# Patient Record
Sex: Female | Born: 2000 | Race: Black or African American | Hispanic: No | Marital: Single | Smoking: Never smoker
Health system: Southern US, Community
[De-identification: ages and names within clinical notes are randomized; demographics above are authoritative.]

## PROBLEM LIST (undated history)

## (undated) DIAGNOSIS — D571 Sickle-cell disease without crisis: Secondary | ICD-10-CM

---

## 2021-03-12 ENCOUNTER — Emergency Department (HOSPITAL_COMMUNITY)
Admission: EM | Admit: 2021-03-12 | Discharge: 2021-03-12 | Disposition: A | Discharge status: Home / Self Care | Payer: 59 | Attending: Emergency Medicine | Admitting: Emergency Medicine

## 2021-03-12 ENCOUNTER — Encounter (HOSPITAL_COMMUNITY): Payer: Self-pay

## 2021-03-12 ENCOUNTER — Emergency Department (HOSPITAL_COMMUNITY): Payer: 59

## 2021-03-12 ENCOUNTER — Other Ambulatory Visit: Payer: Self-pay

## 2021-03-12 DIAGNOSIS — R000 Tachycardia, unspecified: Secondary | ICD-10-CM | POA: Diagnosis not present

## 2021-03-12 DIAGNOSIS — J111 Influenza due to unidentified influenza virus with other respiratory manifestations: Secondary | ICD-10-CM

## 2021-03-12 DIAGNOSIS — J101 Influenza due to other identified influenza virus with other respiratory manifestations: Secondary | ICD-10-CM | POA: Insufficient documentation

## 2021-03-12 DIAGNOSIS — Z20822 Contact with and (suspected) exposure to covid-19: Secondary | ICD-10-CM | POA: Insufficient documentation

## 2021-03-12 DIAGNOSIS — R509 Fever, unspecified: Secondary | ICD-10-CM | POA: Diagnosis present

## 2021-03-12 HISTORY — DX: Sickle-cell disease without crisis: D57.1

## 2021-03-12 LAB — COMPREHENSIVE METABOLIC PANEL
ALT: 21 U/L (ref 0–44)
AST: 40 U/L (ref 15–41)
Albumin: 3.9 g/dL (ref 3.5–5.0)
Alkaline Phosphatase: 77 U/L (ref 38–126)
Anion gap: 10 (ref 5–15)
BUN: <5 mg/dL — ABNORMAL LOW (ref 6–20)
CO2: 22 mmol/L (ref 22–32)
Calcium: 9 mg/dL (ref 8.9–10.3)
Chloride: 102 mmol/L (ref 98–111)
Creatinine, Ser: 0.75 mg/dL (ref 0.44–1.00)
GFR, Estimated: >60 mL/min (ref >60)
Glucose, Bld: 131 mg/dL — ABNORMAL HIGH (ref 70–99)
Potassium: 3.9 mmol/L (ref 3.5–5.1)
Sodium: 134 mmol/L — ABNORMAL LOW (ref 135–145)
Total Bilirubin: 2.3 mg/dL — ABNORMAL HIGH (ref 0.3–1.2)
Total Protein: 7.7 g/dL (ref 6.5–8.1)

## 2021-03-12 LAB — CBC WITH DIFFERENTIAL/PLATELET
Abs Immature Granulocytes: 0.13 10*3/uL — ABNORMAL HIGH (ref 0.00–0.07)
Basophils Absolute: 0.1 10*3/uL (ref 0.0–0.1)
Basophils Relative: 0 %
Eosinophils Absolute: 0 10*3/uL (ref 0.0–0.5)
Eosinophils Relative: 0 %
HCT: 21.8 % — ABNORMAL LOW (ref 36.0–46.0)
Hemoglobin: 7.5 g/dL — ABNORMAL LOW (ref 12.0–15.0)
Immature Granulocytes: 1 %
Lymphocytes Relative: 10 %
Lymphs Abs: 1.6 10*3/uL (ref 0.7–4.0)
MCH: 39.9 pg — ABNORMAL HIGH (ref 26.0–34.0)
MCHC: 34.4 g/dL (ref 30.0–36.0)
MCV: 116 fL — ABNORMAL HIGH (ref 80.0–100.0)
Monocytes Absolute: 1.4 10*3/uL — ABNORMAL HIGH (ref 0.1–1.0)
Monocytes Relative: 9 %
Neutro Abs: 12.1 10*3/uL — ABNORMAL HIGH (ref 1.7–7.7)
Neutrophils Relative %: 80 %
Platelets: 438 10*3/uL — ABNORMAL HIGH (ref 150–400)
RBC: 1.88 MIL/uL — ABNORMAL LOW (ref 3.87–5.11)
RDW: 18.7 % — ABNORMAL HIGH (ref 11.5–15.5)
WBC Morphology: INCREASED
WBC: 15.2 10*3/uL — ABNORMAL HIGH (ref 4.0–10.5)
nRBC: 11.6 % — ABNORMAL HIGH (ref 0.0–0.2)

## 2021-03-12 LAB — RESP PANEL BY RT-PCR (FLU A&B, COVID) ARPGX2
Influenza A by PCR: POSITIVE — AB
Influenza B by PCR: NEGATIVE
SARS Coronavirus 2 by RT PCR: NEGATIVE

## 2021-03-12 LAB — GROUP A STREP BY PCR: Group A Strep by PCR: NOT DETECTED

## 2021-03-12 LAB — I-STAT BETA HCG BLOOD, ED (MC, WL, AP ONLY): I-stat hCG, quantitative: <5 m[IU]/mL (ref <5)

## 2021-03-12 MED ORDER — ACETAMINOPHEN 325 MG PO TABS
650.0000 mg | ORAL_TABLET | Freq: Once | ORAL | Status: AC | PRN
  Administered 2021-03-12: 650 mg via ORAL
  Filled 2021-03-12: qty 2

## 2021-03-12 NOTE — ED Provider Notes (Signed)
Emergency Medicine Provider Triage Evaluation Note  Sarah Patterson , a 20 y.o. female  was evaluated in triage.  Pt complains of fever, and cough with nasal congestion. She has a history of sickle cell anemia.  She is vaccinated against COVID.  Her symptoms started about a week ago and have been worsening.  She denies having any sickle cell pain crisis.  Review of Systems  Positive: Cough, nasal congestion, fever, sore throat.  Negative: Chest pain, shortness of breath  Physical Exam  BP 117/61 (BP Location: Left Arm)   Pulse (!) 119   Temp (!) 102 F (38.9 C) (Oral)   Resp 16   Ht 5\' 1"  (1.549 m)   Wt 52.2 kg   LMP 02/22/2021   SpO2 97%   BMI 21.73 kg/m  Gen:   Awake, no distress   Resp:  Normal effort  MSK:   Moves extremities without difficulty  Other:  Eating applesauce in no distress.  Medical Decision Making  Medically screening exam initiated at 12:33 PM.  Appropriate orders placed.  Stephene Nuon was informed that the remainder of the evaluation will be completed by another provider, this initial triage assessment does not replace that evaluation, and the importance of remaining in the ED until their evaluation is complete.  Given that patient has sickle cell and is presumed to be  asplenic with a fever and tachycardia will order lab work. Additionally given her cough and concern for getting better and then worse we will order a chest x-ray to evaluate for a superimposed bacterial pneumonia.  Note: Portions of this report may have been transcribed using voice recognition software. Every effort was made to ensure accuracy; however, inadvertent computerized transcription errors may be present      Faylene Million 03/12/21 1238    05/12/21, MD 03/12/21 [id number]

## 2021-03-12 NOTE — ED Provider Notes (Signed)
[facility] EMERGENCY DEPARTMENT Provider Note   CSN: [id number] Arrival date & time: 03/12/21  1049     History Chief Complaint  Patient presents with   Fever   Nasal Congestion    Sarah Patterson is a 20 y.o. female presenting for evaluation of fever, nasal congestion, cough.  Patient states she has had symptoms for a week.  Symptoms were improving until today when her fever came back.  Temperature of 103.  She reports nonproductive cough, sore throat, nasal congestion.  She has been taking Tylenol and allergy medicines with mild improvement of symptoms.  She denies chest pain, shortness of breath, nausea, vomiting, abdominal pain.  She has a history of sickle cell for which she takes medication, no other medical problems.  Baseline hemoglobin between 7 and 9.  She denies sick contacts.  She is here today because her fever came back and she was instructed by her PCP to be evaluated. She has been eating a drinking ok.   HPI     Past Medical History:  Diagnosis Date   Sickle cell anemia (HCC)     There are no problems to display for this patient.   History reviewed. No pertinent surgical history.   OB History   No obstetric history on file.     No family history on file.     Home Medications Prior to Admission medications   Medication Sig Start Date End Date Taking? Authorizing Provider  acetaminophen (TYLENOL) 500 MG tablet Take 500 mg by mouth every 6 (six) hours as needed for moderate pain.   Yes [provider]  celecoxib (CELEBREX) 100 MG capsule Take 100 mg by mouth 2 (two) times daily. 01/02/21  Yes [provider]  Cholecalciferol 25 MCG (1000 UT) tablet Take 1,000 Units by mouth daily. 05/25/17  Yes [provider]  gabapentin (NEURONTIN) 300 MG capsule Take 300-600 mg by mouth See admin instructions. Takes 300 mg in the morning 600 mg in the afternoon and night 02/17/21  Yes [provider]  hydroxyurea (HYDREA)  500 MG capsule Take 1,500 mg by mouth at bedtime. 03/07/21  Yes [provider]  lisinopril (ZESTRIL) 5 MG tablet Take 5 mg by mouth daily. 02/17/21  Yes [provider]  magnesium oxide (MAG-OX) 400 MG tablet Take 400 mg by mouth daily. 05/26/17  Yes [provider]  oxyCODONE (OXY IR/ROXICODONE) 5 MG immediate release tablet Take 5 mg by mouth every 6 (six) hours as needed for severe pain. 01/02/21  Yes [provider]  penicillin v potassium (VEETID) 250 MG tablet Take 250 mg by mouth 2 (two) times daily. 01/27/21  Yes [provider]    Allergies    Patient has no known allergies.  Review of Systems   Review of Systems  Constitutional:  Positive for fever.  HENT:  Positive for congestion.   Respiratory:  Positive for cough.   All other systems reviewed and are negative.  Physical Exam Updated Vital Signs BP 120/75 (BP Location: Right Arm)   Pulse (!) 108   Temp 99 F (37.2 C) (Oral)   Resp 20   Ht 5\' 1"  (1.549 m)   Wt 52.2 kg   LMP 02/22/2021   SpO2 98%   BMI 21.73 kg/m   Physical Exam Vitals and nursing note reviewed.  Constitutional:      General: She is not in acute distress.    Appearance: Normal appearance.     Comments: Resting in the  bed in NAD  HENT:     Head: Normocephalic and atraumatic.     Comments: OP mildly erythematous without tonsillar swelling or exudate.  Uvula midline.  No muffled voice.  Handling secretions easily Eyes:     Conjunctiva/sclera: Conjunctivae normal.     Pupils: Pupils are equal, round, and reactive to light.  Cardiovascular:     Rate and Rhythm: Regular rhythm. Tachycardia present.     Pulses: Normal pulses.     Heart sounds: Murmur heard.     Comments: Mildly tachycardic around 110 Pulmonary:     Effort: Pulmonary effort is normal. No respiratory distress.     Breath sounds: Normal breath sounds. No wheezing.     Comments: Speaking in full sentences.  Clear lung sounds in all fields.  SpO2 100% on RA Abdominal:     General: There is no distension.     Palpations: Abdomen is soft. There is no mass.     Tenderness: There is no abdominal tenderness. There is no guarding or rebound.  Musculoskeletal:        General: Normal range of motion.     Cervical back: Normal range of motion and neck supple.  Skin:    General: Skin is warm and dry.     Capillary Refill: Capillary refill takes less than 2 seconds.  Neurological:     Mental Status: She is alert and oriented to person, place, and time.  Psychiatric:        Mood and Affect: Mood and affect normal.        Speech: Speech normal.        Behavior: Behavior normal.    ED Results / Procedures / Treatments   Labs (all labs ordered are listed, but only abnormal results are displayed) Labs Reviewed  RESP PANEL BY RT-PCR (FLU A&B, COVID) ARPGX2 - Abnormal; Notable for the following components:      Result Value   Influenza A by PCR POSITIVE (*)    All other components within normal limits  CBC WITH DIFFERENTIAL/PLATELET - Abnormal; Notable for the following components:   WBC 15.2 (*)    RBC 1.88 (*)    Hemoglobin 7.5 (*)    HCT 21.8 (*)    MCV 116.0 (*)    MCH 39.9 (*)    RDW 18.7 (*)    Platelets 438 (*)    nRBC 11.6 (*)    Neutro Abs 12.1 (*)    Monocytes Absolute 1.4 (*)    Abs Immature Granulocytes 0.13 (*)    All other components within normal limits  COMPREHENSIVE METABOLIC PANEL - Abnormal; Notable for the following components:   Sodium 134 (*)    Glucose, Bld 131 (*)    BUN <5 (*)    Total Bilirubin 2.3 (*)    All other components within normal limits  GROUP A STREP BY PCR  LACTIC ACID, PLASMA  I-STAT BETA HCG BLOOD, ED ([facility], [facility], [facility] ONLY)    EKG None  Radiology DG Chest 2 View  Result Date: 03/12/2021 CLINICAL DATA:  Productive cough. EXAM: CHEST - 2 VIEW COMPARISON:  None. FINDINGS: The heart size and mediastinal contours are within normal limits. Both lungs are clear. The visualized skeletal  structures are unremarkable. IMPRESSION: No active cardiopulmonary disease. Electronically Signed   By: Lupita Raider M.D.   On: 03/12/2021 13:24    Procedures Procedures   Medications Ordered in ED Medications  acetaminophen (TYLENOL) tablet 650 mg (650 mg Oral Given  03/12/21 1221)    ED Course  I have reviewed the triage vital signs and the nursing notes.  Pertinent labs & imaging results that were available during my care of the patient were reviewed by me and considered in my medical decision making (see chart for details).   MDM Rules/Calculators/A&P                           Pt presenting for evaluation of 1 wk h/o URI sxs. On exam, pt appears nontoxic. She is mildly tachycardic, like 2/2 viral illness and fever.  Labs obtained in triage interpreted by me.  Flu positive.  Mild leukocytosis, likely reactive to illness.  Patient is anemic with hemoglobin of 7.5, however per patient this is her baseline.  Otherwise labs are reassuring.  Bili is mildly elevated, however this is likely due to dehydration and sickle cell.  She has no abdominal pain, doubt gallbladder etiology.  Chest x-ray viewed and independently declining, no pneumonia or some effusion.  Discussed findings with patient.  Discussed likely cause of her symptoms as flu and continue supportive treatment.  As she is a week into symptoms I will not offer Xofluza or Tamiflu.  As patient has no chest pain or shortness of breath, although I have low concern for acute chest.  However I discussed importance of close monitoring of respiratory status and prompt return with any of the symptoms.  At this time, patient appears safe for discharge.  Return precautions given.  Patient states she understands and agrees to plan.   Final Clinical Impression(s) / ED Diagnoses Final diagnoses:  Flu    Rx / DC Orders ED Discharge Orders     None        Alveria Apley, PA-C 03/12/21 1748    Terald Sleeper, MD 03/12/21 2108

## 2021-03-12 NOTE — Discharge Instructions (Signed)
You tested positive for flu. This is a viral illness that should be treated symptomatically. Use Tylenol or ibuprofen as needed for fevers or body aches. Use cough medicine as needed.  Make sure you stay well-hydrated with water. Wash your hands frequently to prevent spread of infection. You are considered infectious until you have been fever free for 24 hours without medication Follow-up with your primary care doctor in 1 week if your symptoms are not improving. Return to the emergency room if you develop chest pain, difficulty breathing, or any new or worsening symptoms.

## 2021-03-12 NOTE — ED Triage Notes (Signed)
Pt reports fever, cough and runny nose for the past week. Home COVID test was negative. Resp e.u

## 2022-06-15 ENCOUNTER — Emergency Department (HOSPITAL_COMMUNITY): Payer: BLUE CROSS/BLUE SHIELD

## 2022-06-15 ENCOUNTER — Other Ambulatory Visit: Payer: Self-pay

## 2022-06-15 ENCOUNTER — Emergency Department (HOSPITAL_COMMUNITY)
Admission: EM | Admit: 2022-06-15 | Discharge: 2022-06-16 | Disposition: A | Discharge status: Home / Self Care | Payer: BLUE CROSS/BLUE SHIELD | Attending: Emergency Medicine | Admitting: Emergency Medicine

## 2022-06-15 DIAGNOSIS — I10 Essential (primary) hypertension: Secondary | ICD-10-CM | POA: Insufficient documentation

## 2022-06-15 DIAGNOSIS — Z79899 Other long term (current) drug therapy: Secondary | ICD-10-CM | POA: Insufficient documentation

## 2022-06-15 DIAGNOSIS — R509 Fever, unspecified: Secondary | ICD-10-CM | POA: Diagnosis present

## 2022-06-15 DIAGNOSIS — D5700 Hb-SS disease with crisis, unspecified: Secondary | ICD-10-CM | POA: Insufficient documentation

## 2022-06-15 DIAGNOSIS — D649 Anemia, unspecified: Secondary | ICD-10-CM

## 2022-06-15 DIAGNOSIS — Z1152 Encounter for screening for COVID-19: Secondary | ICD-10-CM | POA: Insufficient documentation

## 2022-06-15 DIAGNOSIS — D571 Sickle-cell disease without crisis: Secondary | ICD-10-CM

## 2022-06-15 LAB — CBC WITH DIFFERENTIAL/PLATELET
Abs Immature Granulocytes: 0.04 10*3/uL (ref 0.00–0.07)
Basophils Absolute: 0 10*3/uL (ref 0.0–0.1)
Basophils Relative: 0 %
Eosinophils Absolute: 0 10*3/uL (ref 0.0–0.5)
Eosinophils Relative: 0 %
HCT: 18.8 % — ABNORMAL LOW (ref 36.0–46.0)
Hemoglobin: 6.5 g/dL — CL (ref 12.0–15.0)
Immature Granulocytes: 1 %
Lymphocytes Relative: 47 %
Lymphs Abs: 2.9 10*3/uL (ref 0.7–4.0)
MCH: 38.7 pg — ABNORMAL HIGH (ref 26.0–34.0)
MCHC: 34.6 g/dL (ref 30.0–36.0)
MCV: 111.9 fL — ABNORMAL HIGH (ref 80.0–100.0)
Monocytes Absolute: 0.4 10*3/uL (ref 0.1–1.0)
Monocytes Relative: 6 %
Neutro Abs: 2.8 10*3/uL (ref 1.7–7.7)
Neutrophils Relative %: 46 %
Platelets: 544 10*3/uL — ABNORMAL HIGH (ref 150–400)
RBC: 1.68 MIL/uL — ABNORMAL LOW (ref 3.87–5.11)
RDW: 17.2 % — ABNORMAL HIGH (ref 11.5–15.5)
WBC: 6.1 10*3/uL (ref 4.0–10.5)
nRBC: 34.7 % — ABNORMAL HIGH (ref 0.0–0.2)

## 2022-06-15 LAB — URINALYSIS, ROUTINE W REFLEX MICROSCOPIC
Bilirubin Urine: NEGATIVE
Glucose, UA: NEGATIVE mg/dL
Hgb urine dipstick: NEGATIVE
Ketones, ur: NEGATIVE mg/dL
Leukocytes,Ua: NEGATIVE
Nitrite: NEGATIVE
Protein, ur: NEGATIVE mg/dL
Specific Gravity, Urine: 1.008 (ref 1.005–1.030)
pH: 6 (ref 5.0–8.0)

## 2022-06-15 LAB — COMPREHENSIVE METABOLIC PANEL
ALT: 23 U/L (ref 0–44)
AST: 47 U/L — ABNORMAL HIGH (ref 15–41)
Albumin: 4.2 g/dL (ref 3.5–5.0)
Alkaline Phosphatase: 63 U/L (ref 38–126)
Anion gap: 8 (ref 5–15)
BUN: 11 mg/dL (ref 6–20)
CO2: 21 mmol/L — ABNORMAL LOW (ref 22–32)
Calcium: 8.6 mg/dL — ABNORMAL LOW (ref 8.9–10.3)
Chloride: 101 mmol/L (ref 98–111)
Creatinine, Ser: 0.76 mg/dL (ref 0.44–1.00)
GFR, Estimated: >60 mL/min (ref >60)
Glucose, Bld: 95 mg/dL (ref 70–99)
Potassium: 3.7 mmol/L (ref 3.5–5.1)
Sodium: 130 mmol/L — ABNORMAL LOW (ref 135–145)
Total Bilirubin: 1 mg/dL (ref 0.3–1.2)
Total Protein: 8.6 g/dL — ABNORMAL HIGH (ref 6.5–8.1)

## 2022-06-15 LAB — RESP PANEL BY RT-PCR (RSV, FLU A&B, COVID)  RVPGX2
Influenza A by PCR: NEGATIVE
Influenza B by PCR: NEGATIVE
Resp Syncytial Virus by PCR: NEGATIVE
SARS Coronavirus 2 by RT PCR: NEGATIVE

## 2022-06-15 LAB — I-STAT BETA HCG BLOOD, ED (MC, WL, AP ONLY): I-stat hCG, quantitative: <5 m[IU]/mL (ref <5)

## 2022-06-15 LAB — RETICULOCYTES
Immature Retic Fract: 13.7 % (ref 2.3–15.9)
RBC.: 1.69 MIL/uL — ABNORMAL LOW (ref 3.87–5.11)
Retic Count, Absolute: 43.1 10*3/uL (ref 19.0–186.0)
Retic Ct Pct: 2.6 % (ref 0.4–3.1)

## 2022-06-15 LAB — ABO/RH: ABO/RH(D): B POS

## 2022-06-15 LAB — LACTIC ACID, PLASMA: Lactic Acid, Venous: 0.9 mmol/L (ref 0.5–1.9)

## 2022-06-15 LAB — PREPARE RBC (CROSSMATCH)

## 2022-06-15 MED ORDER — SODIUM CHLORIDE 0.9 % IV SOLN
2.0000 g | Freq: Once | INTRAVENOUS | Status: AC
  Administered 2022-06-15: 2 g via INTRAVENOUS
  Filled 2022-06-15: qty 20

## 2022-06-15 MED ORDER — KETOROLAC TROMETHAMINE 30 MG/ML IJ SOLN
30.0000 mg | Freq: Once | INTRAMUSCULAR | Status: AC
  Administered 2022-06-15: 30 mg via INTRAVENOUS
  Filled 2022-06-15: qty 1

## 2022-06-15 MED ORDER — SODIUM CHLORIDE 0.9% IV SOLUTION: Freq: Once | INTRAVENOUS | Status: DC

## 2022-06-15 MED ORDER — CEFDINIR 300 MG PO CAPS: 300.0000 mg | ORAL_CAPSULE | Freq: Two times a day (BID) | ORAL | 0 refills | Status: AC

## 2022-06-15 MED ORDER — SODIUM CHLORIDE 0.9 % IV BOLUS
1000.0000 mL | Freq: Once | INTRAVENOUS | Status: AC
  Administered 2022-06-15: 1000 mL via INTRAVENOUS

## 2022-06-15 NOTE — ED Notes (Signed)
Will perform US PIV shortly. 

## 2022-06-15 NOTE — ED Notes (Signed)
Patient hard stick. Will Call IV team.

## 2022-06-15 NOTE — ED Provider Triage Note (Signed)
Emergency Medicine Provider Triage Evaluation Note  Sarah Patterson , a 22 y.o. female  was evaluated in triage.  Pt complains of fever today of 104 when she woke up.  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  She endorses nasal congestion but denies any other symptoms.  No sickle cell pain crisis type presentation.  She denies any chest pain, cough, dysuria, hematuria, increased urinary frequency.  Endorses nausea but no vomiting..  Review of Systems  Per HPI  Physical Exam  BP 104/62   Pulse 92   Temp 98.3 F (36.8 C) (Oral)   Resp 16   SpO2 100%  Gen:   Awake, no distress   Resp:  Normal effort  MSK:   Moves extremities without difficulty  Other:    Medical Decision Making  Medically screening exam initiated at 7:04 PM.  Appropriate orders placed.  Sarah Patterson was informed that the remainder of the evaluation will be completed by another provider, this initial triage assessment does not replace that evaluation, and the importance of remaining in the ED until their evaluation is complete.     Sarah Raring, PA-C 06/15/22 1904

## 2022-06-15 NOTE — ED Notes (Signed)
Labeled specimen cup given to pt for U/A collection per MD order. [employer] 

## 2022-06-15 NOTE — ED Triage Notes (Addendum)
Patient report of 104 fever this morning. Patient report took tylenol. Primary care recommended patient to come to ED. Pt denies N/V. Pt hx of Sickle cell.

## 2022-06-15 NOTE — ED Notes (Signed)
Unable to collect ordered labs d/t unable to access phlebotomy site. RN advised. [employer]

## 2022-06-15 NOTE — Discharge Instructions (Addendum)
You are anemic and your current hemoglobin is 6.5.  Per your sickle cell doctor, you need to get repeat hemoglobin check in 2 days.  You can go to student health and try and get a repeat hemoglobin check.  If not you may need to contact your sickle cell doctor and get an outpatient CBC ordered to be done at [place] can also try and call our sickle cell center to get scheduled for lab work and follow-up  Your sickle cell doctor recommend Omnicef 300 mg twice daily for 3 days.  Take Tylenol if you have a fever.  Return to ER if you have persistent fever or trouble breathing or weakness or passing out

## 2022-06-15 NOTE — ED Provider Notes (Signed)
[facility] DEPT Provider Note   CSN: [id number] Arrival date & time: 06/15/22  1735     History  Chief Complaint  Patient presents with   Fever    Sarah Patterson is a 22 y.o. female hx HTN, sickle cell anemia, here presenting with fever.  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  She states that she woke up this morning and had cold sweats.  She noted that she had a fever 104.  She took Tylenol and then went to the school and [facility] and was afebrile at that time. Denies any back or leg pain. Sent here for further evaluation. States that this is not typical for her sickle cell pain crisis. She is from [place].   The history is provided by the patient.       Home Medications Prior to Admission medications   Medication Sig Start Date End Date Taking? Authorizing Provider  acetaminophen (TYLENOL) 500 MG tablet Take 500 mg by mouth every 6 (six) hours as needed for moderate pain.    [provider]  celecoxib (CELEBREX) 100 MG capsule Take 100 mg by mouth 2 (two) times daily. 01/02/21   [provider]  Cholecalciferol 25 MCG (1000 UT) tablet Take 1,000 Units by mouth daily. 05/25/17   [provider]  gabapentin (NEURONTIN) 300 MG capsule Take 300-600 mg by mouth See admin instructions. Takes 300 mg in the morning 600 mg in the afternoon and night 02/17/21   [provider]  hydroxyurea (HYDREA) 500 MG capsule Take 1,500 mg by mouth at bedtime. 03/07/21   [provider]  lisinopril (ZESTRIL) 5 MG tablet Take 5 mg by mouth daily. 02/17/21   [provider]  magnesium oxide (MAG-OX) 400 MG tablet Take 400 mg by mouth daily. 05/26/17   [provider]  oxyCODONE (OXY IR/ROXICODONE) 5 MG immediate release tablet Take 5 mg by mouth every 6 (six) hours as needed for severe pain. 01/02/21   [provider]  penicillin v potassium (VEETID) 250 MG tablet Take 250 mg by mouth 2  (two) times daily. 01/27/21   [provider]      Allergies    Patient has no known allergies.    Review of Systems   Review of Systems  Constitutional:  Positive for fever.  All other systems reviewed and are negative.   Physical Exam Updated Vital Signs BP 104/62   Pulse 92   Temp 98.3 F (36.8 C) (Oral)   Resp 16   LMP 06/08/2022 (Approximate)   SpO2 100%  Physical Exam Vitals and nursing note reviewed.  Constitutional:      Appearance: Normal appearance.  HENT:     Head: Normocephalic.     Nose: Nose normal.     Mouth/Throat:     Mouth: Mucous membranes are moist.  Eyes:     Extraocular Movements: Extraocular movements intact.     Pupils: Pupils are equal, round, and reactive to light.  Cardiovascular:     Rate and Rhythm: Normal rate and regular rhythm.     Pulses: Normal pulses.     Heart sounds: Normal heart sounds.  Pulmonary:     Effort: Pulmonary effort is normal.     Breath sounds: Normal breath sounds.  Abdominal:     General: Abdomen is flat.     Palpations: Abdomen is soft.  Musculoskeletal:        General: Normal range of motion.  Cervical back: Normal range of motion and neck supple.  Skin:    General: Skin is warm.     Capillary Refill: Capillary refill takes less than 2 seconds.  Neurological:     General: No focal deficit present.     Mental Status: She is alert and oriented to person, place, and time.  Psychiatric:        Mood and Affect: Mood normal.        Behavior: Behavior normal.     ED Results / Procedures / Treatments   Labs (all labs ordered are listed, but only abnormal results are displayed) Labs Reviewed  RETICULOCYTES - Abnormal; Notable for the following components:      Result Value   RBC. 1.69 (*)    All other components within normal limits  RESP PANEL BY RT-PCR (RSV, FLU A&B, COVID)  RVPGX2  COMPREHENSIVE METABOLIC PANEL  CBC WITH DIFFERENTIAL/PLATELET  URINALYSIS, ROUTINE W REFLEX MICROSCOPIC   LACTIC ACID, PLASMA  LACTIC ACID, PLASMA  I-STAT BETA HCG BLOOD, ED ([facility], [facility], [facility] ONLY)    EKG None  Radiology DG Chest 2 View  Result Date: 06/15/2022 CLINICAL DATA:  Cough EXAM: CHEST - 2 VIEW COMPARISON:  03/12/2021 FINDINGS: The heart size and mediastinal contours are within normal limits. Both lungs are clear. The visualized skeletal structures are unremarkable. IMPRESSION: No active cardiopulmonary disease. Electronically Signed   By: Helyn Numbers M.D.   On: 06/15/2022 19:35    Procedures Procedures    Medications Ordered in ED Medications  sodium chloride 0.9 % bolus 1,000 mL (1,000 mLs Intravenous New Bag/Given 06/15/22 2106)  ketorolac (TORADOL) 30 MG/ML injection 30 mg (30 mg Intravenous Given 06/15/22 2106)    ED Course/ Medical Decision Making/ A&P                           Medical Decision Making Sarah Patterson is a 22 y.o. female here presenting with fever.  She has sickle cell but this is not typical of her crisis.  She is afebrile and no oxygen requirement. Consider acute chest vs pneumonia vs viral syndrome vs UTI. Will get cbc, cmp, ua, cxr, covid/flu/rsv. Will hydrate and reassess.   10:04 PM Patient's white blood cell count is normal.  Hemoglobin is 6.5.  On care everywhere her most recent hemoglobin is around 8.5.  Reticulocyte count is normal.  Lactate is normal and chest x-ray is clear.  Patient states that she has received transfusion previously and she has no active bleeding.  Her menses ended about a week ago.  She also has antibodies as well.  At this point, she will be admitted for transfusion.  Also will need to observe for fever curve.  I think she likely has a viral illness but will get blood cultures.  Will hold antibiotics for now.  11:09 PM Patient called her sickle cell doctor was told that she does not need a transfusion.  I was able to call her sickle cell doctor.  I discussed with Dr. Myrene Galas from hematology in [place].  She was able to look up her  recent hemoglobin which was 6.74 days ago.  They held off on transfusion since she was not actively bleeding and she appears well.  She does recommend 2 g of Rocephin and 3 days of Omnicef and also repeat hemoglobin in 2 days.  Since patient is afebrile and has normal white count, she does not think that patient needs to be  admitted for IV antibiotics.  Patient is agreeable with the plan.  Problems Addressed: Anemia, unspecified type: acute illness or injury Fever, unspecified fever cause: chronic illness or injury Sickle cell disease without crisis [facility]): acute illness or injury  Amount and/or Complexity of Data Reviewed Labs: ordered. Decision-making details documented in ED Course. Radiology: ordered and independent interpretation performed. Decision-making details documented in ED Course.  Risk Prescription drug management.    Final Clinical Impression(s) / ED Diagnoses Final diagnoses:  None    Rx / DC Orders ED Discharge Orders     None         Charlynne Pander, MD 06/15/22 2311

## 2022-06-19 LAB — TYPE AND SCREEN
ABO/RH(D): B POS
Antibody Screen: NEGATIVE
Unit division: 0

## 2022-06-19 LAB — BPAM RBC
Blood Product Expiration Date: 202401282359
Unit Type and Rh: 7300

## 2022-06-20 LAB — CULTURE, BLOOD (ROUTINE X 2): Culture: NO GROWTH

## 2022-06-22 ENCOUNTER — Observation Stay (HOSPITAL_COMMUNITY)
Admission: EM | Admit: 2022-06-22 | Discharge: 2022-06-23 | Disposition: A | Discharge status: Home / Self Care | Payer: BLUE CROSS/BLUE SHIELD | Attending: Internal Medicine | Admitting: Internal Medicine

## 2022-06-22 ENCOUNTER — Encounter (HOSPITAL_COMMUNITY): Payer: Self-pay

## 2022-06-22 ENCOUNTER — Other Ambulatory Visit: Payer: Self-pay

## 2022-06-22 DIAGNOSIS — D5700 Hb-SS disease with crisis, unspecified: Secondary | ICD-10-CM | POA: Diagnosis present

## 2022-06-22 DIAGNOSIS — D649 Anemia, unspecified: Secondary | ICD-10-CM | POA: Diagnosis not present

## 2022-06-22 DIAGNOSIS — Z79899 Other long term (current) drug therapy: Secondary | ICD-10-CM | POA: Insufficient documentation

## 2022-06-22 LAB — CBC WITH DIFFERENTIAL/PLATELET
Abs Immature Granulocytes: 0.03 10*3/uL (ref 0.00–0.07)
Basophils Absolute: 0 10*3/uL (ref 0.0–0.1)
Basophils Relative: 0 %
Eosinophils Absolute: 0 10*3/uL (ref 0.0–0.5)
Eosinophils Relative: 0 %
HCT: 14.8 % — ABNORMAL LOW (ref 36.0–46.0)
Hemoglobin: 5.2 g/dL — CL (ref 12.0–15.0)
Immature Granulocytes: 1 %
Lymphocytes Relative: 52 %
Lymphs Abs: 3.2 10*3/uL (ref 0.7–4.0)
MCH: 40 pg — ABNORMAL HIGH (ref 26.0–34.0)
MCHC: 35.1 g/dL (ref 30.0–36.0)
MCV: 113.8 fL — ABNORMAL HIGH (ref 80.0–100.0)
Monocytes Absolute: 0.6 10*3/uL (ref 0.1–1.0)
Monocytes Relative: 10 %
Neutro Abs: 2.3 10*3/uL (ref 1.7–7.7)
Neutrophils Relative %: 37 %
Platelets: 262 10*3/uL (ref 150–400)
RBC: 1.3 MIL/uL — ABNORMAL LOW (ref 3.87–5.11)
RDW: 18.4 % — ABNORMAL HIGH (ref 11.5–15.5)
WBC: 6.1 10*3/uL (ref 4.0–10.5)
nRBC: 10.1 % — ABNORMAL HIGH (ref 0.0–0.2)

## 2022-06-22 LAB — URINALYSIS, ROUTINE W REFLEX MICROSCOPIC
Bacteria, UA: NONE SEEN
Bilirubin Urine: NEGATIVE
Glucose, UA: NEGATIVE mg/dL
Hgb urine dipstick: NEGATIVE
Ketones, ur: NEGATIVE mg/dL
Leukocytes,Ua: NEGATIVE
Nitrite: NEGATIVE
Protein, ur: NEGATIVE mg/dL
Specific Gravity, Urine: 1.009 (ref 1.005–1.030)
pH: 6 (ref 5.0–8.0)

## 2022-06-22 LAB — BASIC METABOLIC PANEL
Anion gap: 7 (ref 5–15)
BUN: 6 mg/dL (ref 6–20)
CO2: 23 mmol/L (ref 22–32)
Calcium: 8.4 mg/dL — ABNORMAL LOW (ref 8.9–10.3)
Chloride: 104 mmol/L (ref 98–111)
Creatinine, Ser: 0.77 mg/dL (ref 0.44–1.00)
GFR, Estimated: >60 mL/min (ref >60)
Glucose, Bld: 103 mg/dL — ABNORMAL HIGH (ref 70–99)
Potassium: 3.8 mmol/L (ref 3.5–5.1)
Sodium: 134 mmol/L — ABNORMAL LOW (ref 135–145)

## 2022-06-22 LAB — RETICULOCYTES
Immature Retic Fract: 10.4 % (ref 2.3–15.9)
RBC.: 1.32 MIL/uL — ABNORMAL LOW (ref 3.87–5.11)
Retic Count, Absolute: 19.8 10*3/uL (ref 19.0–186.0)
Retic Ct Pct: 1.5 % (ref 0.4–3.1)

## 2022-06-22 LAB — PREPARE RBC (CROSSMATCH)

## 2022-06-22 LAB — PROTIME-INR
INR: 1.1 (ref 0.8–1.2)
Prothrombin Time: 14.3 seconds (ref 11.4–15.2)

## 2022-06-22 LAB — APTT: aPTT: 30 seconds (ref 24–36)

## 2022-06-22 MED ORDER — SODIUM CHLORIDE 0.9% IV SOLUTION: Freq: Once | INTRAVENOUS | Status: AC

## 2022-06-22 NOTE — ED Triage Notes (Signed)
Patient said her clinic called and said her hemoglobin was 5.6. Has sickle cell. No dizziness but feels tired.

## 2022-06-22 NOTE — ED Provider Triage Note (Signed)
Emergency Medicine Provider Triage Evaluation Note  Sarah Patterson , a 22 y.o. female  was evaluated in triage.  Pt complains of abnormal hemoglobin. States sickle cell clinic reported hgb 5.6 to her. She has been feeling generally weak and fatigued but otherwise denies chest pain, abdominal pain, nausea, vomiting, diarrhea, lightheadedness or dizziness, body aches, cough, congestion, fever, or any other complaints. She denies any known source of bleeding including bloody stools or heavy vaginal bleeding. States has not required a blood transfusion in many years. States typical pain crises consist of chest pain but she is not having that right now.   Review of Systems  Positive: See HPI Negative: See HPI  Physical Exam  BP (!) 112/59 (BP Location: Right Arm)   Pulse 79   Temp 99 F (37.2 C) (Oral)   Resp 19   Ht 5\' 1"  (1.549 m)   Wt 55.3 kg   LMP 06/08/2022 (Approximate)   SpO2 96%   BMI 23.05 kg/m  Gen:   Awake, no distress   Resp:  Normal effort LCTA MSK:   Moves extremities without difficulty  Other:  RRR. Abdomen soft and nontender, no chest wall tenderness, alert and oriented, very pale conjunctiva  Medical Decision Making  Medically screening exam initiated at 2:32 PM.  Appropriate orders placed.  Kaytlynne Kentner was informed that the remainder of the evaluation will be completed by another provider, this initial triage assessment does not replace that evaluation, and the importance of remaining in the ED until their evaluation is complete.     Suzzette Righter, PA-C 06/22/22 1435

## 2022-06-22 NOTE — ED Provider Notes (Signed)
[facility] DEPT Provider Note   CSN: [id number] Arrival date & time: 06/22/22  1338     History  Chief Complaint  Patient presents with   Abnormal Labs    Sarah Patterson is a 22 y.o. female.  22 year old female who presents due to weakness and low hemoglobin.  Patient has history of sickle cell disease and denies being in crisis.  States that she had routine blood work done and hemoglobin was 5.6.  Normally runs around 7.5.  Patient's last transfusion was several years ago.  Denies any heavy periods, black stools, vomiting blood.  She denies any fever or chills.  Patient has been compliant with her sickle cell meds       Home Medications Prior to Admission medications   Medication Sig Start Date End Date Taking? Authorizing Provider  acetaminophen (TYLENOL) 500 MG tablet Take 500 mg by mouth every 6 (six) hours as needed for moderate pain.    [provider]  celecoxib (CELEBREX) 100 MG capsule Take 100 mg by mouth 2 (two) times daily. 01/02/21   [provider]  Cholecalciferol 25 MCG (1000 UT) tablet Take 1,000 Units by mouth daily. 05/25/17   [provider]  gabapentin (NEURONTIN) 300 MG capsule Take 300-600 mg by mouth See admin instructions. Takes 300 mg in the morning 600 mg in the afternoon and night 02/17/21   [provider]  hydroxyurea (HYDREA) 500 MG capsule Take 1,500 mg by mouth at bedtime. 03/07/21   [provider]  lisinopril (ZESTRIL) 5 MG tablet Take 5 mg by mouth daily. 02/17/21   [provider]  magnesium oxide (MAG-OX) 400 MG tablet Take 400 mg by mouth daily. 05/26/17   [provider]  oxyCODONE (OXY IR/ROXICODONE) 5 MG immediate release tablet Take 5 mg by mouth every 6 (six) hours as needed for severe pain. 01/02/21   [provider]  penicillin v potassium (VEETID) 250 MG tablet Take 250 mg by mouth 2 (two) times daily. 01/27/21   [provider]       Allergies    Patient has no known allergies.    Review of Systems   Review of Systems  All other systems reviewed and are negative.   Physical Exam Updated Vital Signs BP (!) 112/59 (BP Location: Right Arm)   Pulse 79   Temp 99 F (37.2 C) (Oral)   Resp 19   Ht 1.549 m (5\' 1" )   Wt 55.3 kg   LMP 06/08/2022 (Approximate)   SpO2 96%   BMI 23.05 kg/m  Physical Exam Vitals and nursing note reviewed.  Constitutional:      General: She is not in acute distress.    Appearance: Normal appearance. She is well-developed. She is not toxic-appearing.  HENT:     Head: Normocephalic and atraumatic.  Eyes:     General: Lids are normal.     Conjunctiva/sclera: Conjunctivae normal.     Pupils: Pupils are equal, round, and reactive to light.  Neck:     Thyroid: No thyroid mass.     Trachea: No tracheal deviation.  Cardiovascular:     Rate and Rhythm: Normal rate and regular rhythm.     Heart sounds: Normal heart sounds. No murmur heard.    No gallop.  Pulmonary:     Effort: Pulmonary effort is normal. No respiratory distress.     Breath sounds: Normal breath sounds. No stridor. No decreased breath sounds, wheezing, rhonchi or rales.  Abdominal:  General: There is no distension.     Palpations: Abdomen is soft.     Tenderness: There is no abdominal tenderness. There is no rebound.  Musculoskeletal:        General: No tenderness. Normal range of motion.     Cervical back: Normal range of motion and neck supple.  Skin:    General: Skin is warm and dry.     Findings: No abrasion or rash.  Neurological:     Mental Status: She is alert and oriented to person, place, and time. Mental status is at baseline.     GCS: GCS eye subscore is 4. GCS verbal subscore is 5. GCS motor subscore is 6.     Cranial Nerves: No cranial nerve deficit.     Sensory: No sensory deficit.     Motor: Motor function is intact.  Psychiatric:        Attention and Perception: Attention normal.         Speech: Speech normal.        Behavior: Behavior normal.     ED Results / Procedures / Treatments   Labs (all labs ordered are listed, but only abnormal results are displayed) Labs Reviewed  CBC WITH DIFFERENTIAL/PLATELET  PROTIME-INR  APTT  RETICULOCYTES  BASIC METABOLIC PANEL  URINALYSIS, ROUTINE W REFLEX MICROSCOPIC  TYPE AND SCREEN    EKG None  Radiology No results found.  Procedures Procedures    Medications Ordered in ED Medications  0.9 %  sodium chloride infusion (Manually program via Guardrails IV Fluids) (has no administration in time range)    ED Course/ Medical Decision Making/ A&P                             Medical Decision Making Amount and/or Complexity of Data Reviewed Labs: ordered.  Risk Prescription drug management.   Patient to receive 2 units of packed red blood cells here.  Signed off to next provider        Final Clinical Impression(s) / ED Diagnoses Final diagnoses:  None    Rx / DC Orders ED Discharge Orders     None         Lacretia Leigh, MD 06/22/22 2330

## 2022-06-23 DIAGNOSIS — D5700 Hb-SS disease with crisis, unspecified: Secondary | ICD-10-CM | POA: Diagnosis present

## 2022-06-23 DIAGNOSIS — D649 Anemia, unspecified: Secondary | ICD-10-CM | POA: Diagnosis not present

## 2022-06-23 LAB — CBC WITH DIFFERENTIAL/PLATELET
Abs Immature Granulocytes: 0.01 10*3/uL (ref 0.00–0.07)
Basophils Absolute: 0 10*3/uL (ref 0.0–0.1)
Basophils Relative: 0 %
Eosinophils Absolute: 0 10*3/uL (ref 0.0–0.5)
Eosinophils Relative: 0 %
HCT: 24.8 % — ABNORMAL LOW (ref 36.0–46.0)
Hemoglobin: 8.6 g/dL — ABNORMAL LOW (ref 12.0–15.0)
Immature Granulocytes: 0 %
Lymphocytes Relative: 40 %
Lymphs Abs: 2.2 10*3/uL (ref 0.7–4.0)
MCH: 30.2 pg (ref 26.0–34.0)
MCHC: 34.7 g/dL (ref 30.0–36.0)
MCV: 87 fL (ref 80.0–100.0)
Monocytes Absolute: 0.6 10*3/uL (ref 0.1–1.0)
Monocytes Relative: 12 %
Neutro Abs: 2.6 10*3/uL (ref 1.7–7.7)
Neutrophils Relative %: 48 %
Platelets: 199 10*3/uL (ref 150–400)
RBC: 2.85 MIL/uL — ABNORMAL LOW (ref 3.87–5.11)
RDW: 33.6 % — ABNORMAL HIGH (ref 11.5–15.5)
WBC: 5.5 10*3/uL (ref 4.0–10.5)
nRBC: 11.7 % — ABNORMAL HIGH (ref 0.0–0.2)

## 2022-06-23 LAB — COMPREHENSIVE METABOLIC PANEL
ALT: 25 U/L (ref 0–44)
AST: 57 U/L — ABNORMAL HIGH (ref 15–41)
Albumin: 3.3 g/dL — ABNORMAL LOW (ref 3.5–5.0)
Alkaline Phosphatase: 62 U/L (ref 38–126)
Anion gap: 12 (ref 5–15)
BUN: 8 mg/dL (ref 6–20)
CO2: 20 mmol/L — ABNORMAL LOW (ref 22–32)
Calcium: 8.5 mg/dL — ABNORMAL LOW (ref 8.9–10.3)
Chloride: 104 mmol/L (ref 98–111)
Creatinine, Ser: 0.65 mg/dL (ref 0.44–1.00)
GFR, Estimated: >60 mL/min (ref >60)
Glucose, Bld: 103 mg/dL — ABNORMAL HIGH (ref 70–99)
Potassium: 4.3 mmol/L (ref 3.5–5.1)
Sodium: 136 mmol/L (ref 135–145)
Total Bilirubin: 0.8 mg/dL (ref 0.3–1.2)
Total Protein: 7.4 g/dL (ref 6.5–8.1)

## 2022-06-23 LAB — RETICULOCYTES
Immature Retic Fract: 10.4 % (ref 2.3–15.9)
RBC.: 2.84 MIL/uL — ABNORMAL LOW (ref 3.87–5.11)
Retic Count, Absolute: 43.5 10*3/uL (ref 19.0–186.0)
Retic Ct Pct: 1.5 % (ref 0.4–3.1)

## 2022-06-23 LAB — HEMOGLOBIN AND HEMATOCRIT, BLOOD
HCT: 17.5 % — ABNORMAL LOW (ref 36.0–46.0)
Hemoglobin: 6.3 g/dL — CL (ref 12.0–15.0)

## 2022-06-23 LAB — MAGNESIUM: Magnesium: 1.8 mg/dL (ref 1.7–2.4)

## 2022-06-23 MED ORDER — ACETAMINOPHEN 650 MG RE SUPP: 650.0000 mg | Freq: Four times a day (QID) | RECTAL | Status: DC | PRN

## 2022-06-23 MED ORDER — MELATONIN 3 MG PO TABS: 3.0000 mg | ORAL_TABLET | Freq: Every evening | ORAL | Status: DC | PRN

## 2022-06-23 MED ORDER — ACETAMINOPHEN 325 MG PO TABS
650.0000 mg | ORAL_TABLET | Freq: Four times a day (QID) | ORAL | Status: DC | PRN
  Administered 2022-06-23: 650 mg via ORAL
  Filled 2022-06-23: qty 2

## 2022-06-23 NOTE — Discharge Summary (Signed)
Physician Discharge Summary  [facility] OAC:[id number] DOB: [birth year] DOA: 06/22/2022  PCP: Pcp, No  Admit date: 06/22/2022  Discharge date: 06/23/2022  Discharge Diagnoses:  Principal Problem:   Acute on chronic anemia Active Problems:   Sickle cell pain crisis [facility])   Discharge Condition: Stable  Disposition:   Follow-up Information     Massie Maroon, FNP Follow up.   Specialty: Family Medicine Why: Follow up for labs on 06/26/2022 Contact information: [address] [place] [address] [phone]                Pt is discharged home in good condition and is to follow up with Pcp, No this week to have labs evaluated. Sarah Patterson is instructed to increase activity slowly and balance with rest for the next few days, and use prescribed medication to complete treatment of pain  Diet: Regular Wt Readings from Last 3 Encounters:  06/22/22 55.3 kg  03/12/21 52.2 kg (27 %, Z= -0.60)*   * Growth percentiles are based on CDC (Girls, 2-20 Years) data.   Expand All Collapse All  H&P  Patient Demographics:  Sarah Patterson, is a 22 y.o. female  MRN: [id number]   DOB - 08/02/2000   Admit Date - 06/22/2022   Outpatient Primary MD for the patient is Pcp, No      Chief Complaint  Patient presents with   Abnormal Labs       HPI:    Sarah Patterson  is a 22 y.o. female with a known history of sickle cell disease who presented to the emergency department by way of instruction from her outpatient hematologist for acute on chronic anemia identified per her outpatient labs. Patient lives in [place], but is currently a Consulting civil engineer at the [employer].  Patient states that she had an objective fever 1 week ago.  She states that fever was resolved with acetaminophen, noting no residual or ensuing development of additional fever.  Patient's most recent hemoglobin was 5.6, relative to her baseline hemoglobin.  She was then contacted by her hematologist  with these results and instructed to present to the local emergency department for further evaluation and management thereof, including PRBC transfusion.  The patient is otherwise without acute complaints.  Denies any acute pain or shortness of breath, no dizziness.  No hematuria, melena, or hematochezia. EDP discussed with on-call hematologist for the patient's [facility] hematology team.  Recommended observation and 2 units PRBCs. Patient states that she feels tired.  She is not having any pain at this time.  Patient is status post 2 units PRBCs without complication.  She denies any headache, shortness of breath, urinary symptoms, nausea, vomiting, or diarrhea.   Hospital Course:  Acute on chronic anemia: On admission, hemoglobin 5.3 g/dL.  Patient received 2 units PRBCs.  Hemoglobin 8.6 prior to discharge.  Patient will follow-up in sickle cell clinic on 06/26/2022 for CBC with differential. Patient is not having any pain at this time.  Patient will discharge home in a hemodynamically stable condition.  Sarah Patterson will follow-up with PCP within 1 week of this discharge. Sarah Patterson was counseled extensively about nonpharmacologic means of pain management, patient verbalized understanding and was appreciative of  the care received during this admission.   We discussed the need for good hydration, monitoring of hydration status, avoidance of heat, cold, stress, and infection triggers. We discussed the need to be adherent with taking Hydrea and other home medications. Patient was reminded of the  need to seek medical attention immediately if any symptom of bleeding, anemia, or infection occurs.  Discharge Exam: Vitals:   06/23/22 0800 06/23/22 1146  BP: 100/60   Pulse: (!) 51   Resp: (!) 21   Temp: 98.3 F (36.8 C) 98 F (36.7 C)  SpO2: 97%    Vitals:   06/23/22 0425 06/23/22 0645 06/23/22 0800 06/23/22 1146  BP: 107/74 106/62 100/60   Pulse: 68 70 (!) 51   Resp: (!) 24 20 (!) 21   Temp: 98.5 F (36.9  C)  98.3 F (36.8 C) 98 F (36.7 C)  TempSrc: Oral   Oral  SpO2: 99% 98% 97%   Weight:      Height:        General appearance : Awake, alert, not in any distress. Speech Clear. Not toxic looking HEENT: Atraumatic and Normocephalic, pupils equally reactive to light and accomodation Neck: Supple, no JVD. No cervical lymphadenopathy.  Chest: Good air entry bilaterally, no added sounds  CVS: S1 S2 regular, no murmurs.  Abdomen: Bowel sounds present, Non tender and not distended with no gaurding, rigidity or rebound. Extremities: B/L Lower Ext shows no edema, both legs are warm to touch Neurology: Awake alert, and oriented X 3, CN II-XII intact, Non focal Skin: No Rash  Discharge Instructions  Discharge Instructions     Discharge patient   Complete by: As directed    Discharge disposition: 01-Home or Self Care   Discharge patient date: 06/23/2022      Allergies as of 06/23/2022   No Known Allergies      Medication List     TAKE these medications    acetaminophen 500 MG tablet Commonly known as: TYLENOL Take 500-1,000 mg by mouth every 6 (six) hours as needed for mild pain, moderate pain or headache.   Aleve 220 MG tablet Generic drug: naproxen sodium Take 220-440 mg by mouth 2 (two) times daily as needed (for pain).   celecoxib 100 MG capsule Commonly known as: CELEBREX Take 100 mg by mouth 2 (two) times daily.   famotidine 20 MG tablet Commonly known as: PEPCID Take 20 mg by mouth 2 (two) times daily.   gabapentin 300 MG capsule Commonly known as: NEURONTIN Take 300-600 mg by mouth See admin instructions. Take 300 mg by mouth in the morning, 600 mg in the afternoon, and 600 mg at bedtime   hydroxyurea 500 MG capsule Commonly known as: HYDREA Take 1,500 mg by mouth at bedtime.   lisinopril 5 MG tablet Commonly known as: ZESTRIL Take 5 mg by mouth daily.   magnesium oxide 400 MG tablet Commonly known as: MAG-OX Take 400 mg by mouth daily.   ondansetron  4 MG tablet Commonly known as: ZOFRAN Take 4 mg by mouth every 8 (eight) hours as needed for nausea or vomiting.   oxyCODONE 5 MG immediate release tablet Commonly known as: Oxy IR/ROXICODONE Take 5 mg by mouth every 6 (six) hours as needed for severe pain (during a crisis).   penicillin v potassium 250 MG tablet Commonly known as: VEETID Take 250 mg by mouth 2 (two) times daily.   SF 5000 Plus 1.1 % Crea dental cream Generic drug: sodium fluoride Place 1 Application onto teeth at bedtime.   Vitamin D3 1000 units Caps Take 1,000 Units by mouth daily.        The results of significant diagnostics from this hospitalization (including imaging, microbiology, ancillary and laboratory) are listed below for reference.    Significant  Diagnostic Studies: DG Chest 2 View  Result Date: 06/15/2022 CLINICAL DATA:  Cough EXAM: CHEST - 2 VIEW COMPARISON:  03/12/2021 FINDINGS: The heart size and mediastinal contours are within normal limits. Both lungs are clear. The visualized skeletal structures are unremarkable. IMPRESSION: No active cardiopulmonary disease. Electronically Signed   By: Helyn Numbers M.D.   On: 06/15/2022 19:35    Microbiology: Recent Results (from the past 240 hour(s))  Resp panel by RT-PCR (RSV, Flu A&B, Covid) Anterior Nasal Swab     Status: None   Collection Time: 06/15/22  7:04 PM   Specimen: Anterior Nasal Swab  Result Value Ref Range Status   SARS Coronavirus 2 by RT PCR NEGATIVE NEGATIVE Final    Comment: (NOTE) SARS-CoV-2 target nucleic acids are NOT DETECTED.  The SARS-CoV-2 RNA is generally detectable in upper respiratory specimens during the acute phase of infection. The lowest concentration of SARS-CoV-2 viral copies this assay can detect is 138 copies/mL. A negative result does not preclude SARS-Cov-2 infection and should not be used as the sole basis for treatment or other patient management decisions. A negative result may occur with  improper specimen  collection/handling, submission of specimen other than nasopharyngeal swab, presence of viral mutation(s) within the areas targeted by this assay, and inadequate number of viral copies(<138 copies/mL). A negative result must be combined with clinical observations, patient history, and epidemiological information. The expected result is Negative.  Fact Sheet for Patients:  BloggerCourse.com  Fact Sheet for Healthcare Providers:  SeriousBroker.it  This test is no t yet approved or cleared by the [place] FDA and  has been authorized for detection and/or diagnosis of SARS-CoV-2 by FDA under an Emergency Use Authorization (EUA). This EUA will remain  in effect (meaning this test can be used) for the duration of the COVID-19 declaration under Section 564(b)(1) of the Act, 21 U.S.C.section 360bbb-3(b)(1), unless the authorization is terminated  or revoked sooner.       Influenza A by PCR NEGATIVE NEGATIVE Final   Influenza B by PCR NEGATIVE NEGATIVE Final    Comment: (NOTE) The Xpert Xpress SARS-CoV-2/FLU/RSV plus assay is intended as an aid in the diagnosis of influenza from Nasopharyngeal swab specimens and should not be used as a sole basis for treatment. Nasal washings and aspirates are unacceptable for Xpert Xpress SARS-CoV-2/FLU/RSV testing.  Fact Sheet for Patients: BloggerCourse.com  Fact Sheet for Healthcare Providers: SeriousBroker.it  This test is not yet approved or cleared by the [place] FDA and has been authorized for detection and/or diagnosis of SARS-CoV-2 by FDA under an Emergency Use Authorization (EUA). This EUA will remain in effect (meaning this test can be used) for the duration of the COVID-19 declaration under Section 564(b)(1) of the Act, 21 U.S.C. section 360bbb-3(b)(1), unless the authorization is terminated or revoked.     Resp Syncytial  Virus by PCR NEGATIVE NEGATIVE Final    Comment: (NOTE) Fact Sheet for Patients: BloggerCourse.com  Fact Sheet for Healthcare Providers: SeriousBroker.it  This test is not yet approved or cleared by the [place] FDA and has been authorized for detection and/or diagnosis of SARS-CoV-2 by FDA under an Emergency Use Authorization (EUA). This EUA will remain in effect (meaning this test can be used) for the duration of the COVID-19 declaration under Section 564(b)(1) of the Act, 21 U.S.C. section 360bbb-3(b)(1), unless the authorization is terminated or revoked.  Performed at [facility], [address], [place], [place] [address]   Blood culture (routine x 2)  Status: None   Collection Time: 06/15/22 10:26 PM   Specimen: BLOOD  Result Value Ref Range Status   Specimen Description   Final    BLOOD BLOOD RIGHT FOREARM Performed at [facility] [address], [place], [place] [address]    Special Requests   Final    BOTTLES DRAWN AEROBIC AND ANAEROBIC Blood Culture results may not be optimal due to an inadequate volume of blood received in culture bottles Performed at [facility] [address] [place], [place] [address]    Culture   Final    NO GROWTH 5 DAYS Performed at [facility], [place] [address] [place], [place] [address]    Report Status 06/20/2022 FINAL  Final     Labs: Basic Metabolic Panel: Recent Labs  Lab 06/22/22 1910 06/23/22 0447  NA 134* 136  K 3.8 4.3  CL 104 104  CO2 23 20*  GLUCOSE 103* 103*  BUN 6 8  CREATININE 0.77 0.65  CALCIUM 8.4* 8.5*  MG  --  1.8   Liver Function Tests: Recent Labs  Lab 06/23/22 0447  AST 57*  ALT 25  ALKPHOS 62  BILITOT 0.8  PROT 7.4  ALBUMIN 3.3*   No results for input(s): "LIPASE", "AMYLASE" in the last 168 hours. No results for input(s): "AMMONIA" in the last 168 hours. CBC: Recent  Labs  Lab 06/22/22 1910 06/23/22 0021 06/23/22 1216  WBC 6.1  --  5.5  NEUTROABS 2.3  --  2.6  HGB 5.2* 6.3* 8.6*  HCT 14.8* 17.5* 24.8*  MCV 113.8*  --  87.0  PLT 262  --  199   Cardiac Enzymes: No results for input(s): "CKTOTAL", "CKMB", "CKMBINDEX", "TROPONINI" in the last 168 hours. BNP: Invalid input(s): "POCBNP" CBG: No results for input(s): "GLUCAP" in the last 168 hours.  Time coordinating discharge: 30 minutes  Signed:  Donia Pounds  APRN, MSN, FNP-C [facility] [address] [place], [place] [address] [phone]  [facility] 06/23/2022, 3:15 PM

## 2022-06-23 NOTE — H&P (Signed)
H&P  Patient Demographics:  Sarah Patterson, is a 22 y.o. female  MRN: [id number]   DOB - 2000-10-19  Admit Date - 06/22/2022  Outpatient Primary MD for the patient is Pcp, No  Chief Complaint  Patient presents with   Abnormal Labs      HPI:   Sarah Patterson  is a 22 y.o. female with a known history of sickle cell disease who presented to the emergency department by way of instruction from her outpatient hematologist for acute on chronic anemia identified per her outpatient labs. Patient lives in [place], but is currently a Consulting civil engineer at the [employer].  Patient states that she had an objective fever 1 week ago.  She states that fever was resolved with acetaminophen, noting no residual or ensuing development of additional fever.  Patient's most recent hemoglobin was 5.6, relative to her baseline hemoglobin.  She was then contacted by her hematologist with these results and instructed to present to the local emergency department for further evaluation and management thereof, including PRBC transfusion.  The patient is otherwise without acute complaints.  Denies any acute pain or shortness of breath, no dizziness.  No hematuria, melena, or hematochezia. EDP discussed with on-call hematologist for the patient's [facility] hematology team.  Recommended observation and 2 units PRBCs. Patient states that she feels tired.  She is not having any pain at this time.  Patient is status post 2 units PRBCs without complication.  She denies any headache, shortness of breath, urinary symptoms, nausea, vomiting, or diarrhea.   Review of systems:   No fever or chills No Headache, No changes with vision or hearing No problems swallowing food or liquids No chest pain, cough or shortness of breath No abdominal pain, No nausea or vomiting, Bowel movements are regular No blood in stool or urine No dysuria No new skin rashes or bruises No new joints pains-aches No new weakness, tingling,  numbness in any extremity No recent weight gain or loss No polyuria, polydypsia or polyphagia No significant Mental Stressors  With Past History of the following :   Past Medical History:  Diagnosis Date   Sickle cell anemia (HCC)       History reviewed. No pertinent surgical history.   Social History:   Social History   Tobacco Use   Smoking status: Not on file   Smokeless tobacco: Not on file  Substance Use Topics   Alcohol use: Not on file     Lives - At home   Family History :   History reviewed. No pertinent family history.   Home Medications:   Prior to Admission medications   Medication Sig Start Date End Date Taking? Authorizing Provider  acetaminophen (TYLENOL) 500 MG tablet Take 500-1,000 mg by mouth every 6 (six) hours as needed for mild pain, moderate pain or headache.   Yes [provider]  ALEVE 220 MG tablet Take 220-440 mg by mouth 2 (two) times daily as needed (for pain).   Yes [provider]  celecoxib (CELEBREX) 100 MG capsule Take 100 mg by mouth 2 (two) times daily. 01/02/21  Yes [provider]  Cholecalciferol (VITAMIN D3) 1000 units CAPS Take 1,000 Units by mouth daily.   Yes [provider]  famotidine (PEPCID) 20 MG tablet Take 20 mg by mouth 2 (two) times daily. 06/11/22  Yes [provider]  gabapentin (NEURONTIN) 300 MG capsule Take 300-600 mg by mouth See admin instructions. Take 300 mg by mouth in the morning,  600 mg in the afternoon, and 600 mg at bedtime 02/17/21  Yes [provider]  lisinopril (ZESTRIL) 5 MG tablet Take 5 mg by mouth daily. 02/17/21  Yes [provider]  magnesium oxide (MAG-OX) 400 MG tablet Take 400 mg by mouth daily. 05/26/17  Yes [provider]  oxyCODONE (OXY IR/ROXICODONE) 5 MG immediate release tablet Take 5 mg by mouth every 6 (six) hours as needed for severe pain (during a crisis). 01/02/21  Yes [provider]  penicillin v potassium  (VEETID) 250 MG tablet Take 250 mg by mouth 2 (two) times daily. 01/27/21  Yes [provider]  SF 5000 PLUS 1.1 % CREA dental cream Place 1 Application onto teeth at bedtime. 06/04/22  Yes [provider]  hydroxyurea (HYDREA) 500 MG capsule Take 1,500 mg by mouth at bedtime. Patient not taking: Reported on 06/23/2022 03/07/21   [provider]  ondansetron (ZOFRAN) 4 MG tablet Take 4 mg by mouth every 8 (eight) hours as needed for nausea or vomiting. 06/11/22   [provider]     Allergies:   No Known Allergies   Physical Exam:   Vitals:   Vitals:   06/23/22 0800 06/23/22 1146  BP: 100/60   Pulse: (!) 51   Resp: (!) 21   Temp: 98.3 F (36.8 C) 98 F (36.7 C)  SpO2: 97%     Physical Exam: Constitutional: Patient appears well-developed and well-nourished. Not in obvious distress. HENT: Normocephalic, atraumatic, External right and left ear normal. Oropharynx is clear and moist.  Eyes: Conjunctivae and EOM are normal. PERRLA, no scleral icterus. Neck: Normal ROM. Neck supple. No JVD. No tracheal deviation. No thyromegaly. CVS: RRR, S1/S2 +, no murmurs, no gallops, no carotid bruit.  Pulmonary: Effort and breath sounds normal, no stridor, rhonchi, wheezes, rales.  Abdominal: Soft. BS +, no distension, tenderness, rebound or guarding.  Musculoskeletal: Normal range of motion. No edema and no tenderness.  Lymphadenopathy: No lymphadenopathy noted, cervical, inguinal or axillary Neuro: Alert. Normal reflexes, muscle tone coordination. No cranial nerve deficit. Skin: Skin is warm and dry. No rash noted. Not diaphoretic. No erythema. No pallor. Psychiatric: Normal mood and affect. Behavior, judgment, thought content normal.   Data Review:   CBC Recent Labs  Lab 06/22/22 1910 06/23/22 0021 06/23/22 1216  WBC 6.1  --  5.5  HGB 5.2* 6.3* 8.6*  HCT 14.8* 17.5* 24.8*  PLT 262  --  199  MCV 113.8*  --  87.0  MCH 40.0*  --  30.2  MCHC 35.1  --   34.7  RDW 18.4*  --  33.6*  LYMPHSABS 3.2  --  2.2  MONOABS 0.6  --  0.6  EOSABS 0.0  --  0.0  BASOSABS 0.0  --  0.0   ------------------------------------------------------------------------------------------------------------------  Chemistries  Recent Labs  Lab 06/22/22 1910 06/23/22 0447  NA 134* 136  K 3.8 4.3  CL 104 104  CO2 23 20*  GLUCOSE 103* 103*  BUN 6 8  CREATININE 0.77 0.65  CALCIUM 8.4* 8.5*  MG  --  1.8  AST  --  57*  ALT  --  25  ALKPHOS  --  62  BILITOT  --  0.8   ------------------------------------------------------------------------------------------------------------------ estimated creatinine clearance is 84.6 mL/min (by C-G formula based on SCr of 0.65 mg/dL). ------------------------------------------------------------------------------------------------------------------ No results for input(s): "TSH", "T4TOTAL", "T3FREE", "THYROIDAB" in the last 72 hours.  Invalid input(s): "FREET3"  Coagulation profile Recent Labs  Lab 06/22/22 1910  INR  1.1   ------------------------------------------------------------------------------------------------------------------- No results for input(s): "DDIMER" in the last 72 hours. -------------------------------------------------------------------------------------------------------------------  Cardiac Enzymes No results for input(s): "CKMB", "TROPONINI", "MYOGLOBIN" in the last 168 hours.  Invalid input(s): "CK" ------------------------------------------------------------------------------------------------------------------ No results found for: "BNP"  ---------------------------------------------------------------------------------------------------------------  Urinalysis    Component Value Date/Time   COLORURINE YELLOW 06/22/2022 1432   APPEARANCEUR HAZY (A) 06/22/2022 1432   LABSPEC 1.009 06/22/2022 1432   PHURINE 6.0 06/22/2022 1432   GLUCOSEU NEGATIVE 06/22/2022 1432   HGBUR NEGATIVE  06/22/2022 1432   BILIRUBINUR NEGATIVE 06/22/2022 1432   KETONESUR NEGATIVE 06/22/2022 1432   PROTEINUR NEGATIVE 06/22/2022 1432   NITRITE NEGATIVE 06/22/2022 1432   LEUKOCYTESUR NEGATIVE 06/22/2022 1432    ----------------------------------------------------------------------------------------------------------------   Imaging Results:    No results found.   Assessment & Plan:  Principal Problem:   Acute on chronic anemia Active Problems:   Sickle cell pain crisis (HCC)  Acute on chronic anemia: On admission, hemoglobin 5.2 g/dL.  Patient received 2 units PRBCs without complication.  Awaiting posttransfusion labs. On review of medical records, patient's baseline hemoglobin is around 8.6.  Sickle cell disease: Patient is not having any pain at this time.  She is opiate nave. Tylenol 650 mg every 4 hours as needed  DVT Prophylaxis: Subcut Lovenox   AM Labs Ordered, also please review Full Orders  Family Communication: Admission, patient's condition and plan of care including tests being ordered have been discussed with the patient who indicate understanding and agree with the plan and Code Status.  Code Status: Full Code  Consults called: None    Admission status: Inpatient    Time spent in minutes : 30 minutes  L[place], MSN, FNP-C [facility] [address] [place], [place] [address] [phone]  06/23/2022 at 3:16 PM

## 2022-06-23 NOTE — Discharge Instructions (Signed)
We discussed sickle cell day infusion clinic.  Address [address] [place], [place] [address].  Phone number is [phone].  We are open M-F 8 AM to 4 PM.  Patient intake is from 8 AM to 12.

## 2022-06-23 NOTE — Progress Notes (Signed)
  Carryover admission to the Day Admitter.  I discussed this case with the EDP, Dr.  Ralene Bathe.  Per these discussions:   This is a 22 year old female with known history of sickle cell anemia, who is being admitted with acute on chronic anemia (in the absence of acute sickle cell pain crisis) after presenting to the ED by way of instruction from her outpatient hematologist for acute on chronic anemia identified per outpatient labs.   She lives in [place], but is in the area for college at [employer]. The patient had an objective fever of 104 approximately 1 week ago.  She subsequently experienced an additional objective fever on 06/18/2022, which reportedly resolved with a single dose of acetaminophen, noting no residual or ensuing development of additional fever.   In light intermittent fevers, her outpatient hematologist (in [place]) labs, including CBC, which reflected a low hemoglobin of 5.6 relative to her baseline hemoglobin.  She was then contacted by her hematologist with these results and associated instructions to present to the local emergency department for further evaluation and management thereof, including for PRBC transfusion.   The patient is otherwise without acute complaint at this time.  Denies any acute pain, shortness of breath, dizziness.  Denies any recent melena or hematochezia.  Subsequently, EDP (Dr. Ralene Bathe) discussed with the on-call hematologist (on-call for the patient's [facility]), recommended observation to the hospitalist service for further evaluation and management of acute on chronic anemia, recommending transfusion of 2 units PRBC as well as close trending of ensuing hemoglobin values as well as reticulocyte count.  He recommended 24-hour observation for this purpose, with today's documentation noting that the patient arrived at [facility] emergency department at 1330 on 06/22/2022.  Dr.Rees is including in her documentation the name and contact  information for the [place] based hematologist with whom she discussed the patient's case to procure the above recommendations.   EDP is ordered the recommended 2 units PRBC transfusion.  I have placed an order for observation for further evaluation management of the above.   I have placed some additional preliminary admit orders via the adult multi-morbid admission order set. I have also ordered morning labs, including CMP, CBC, serum magnesium level as well as reticulocyte count.  In the absence of any report of acute pain, not placed any orders for as needed pain medications at this time.     Babs Bertin, DO Hospitalist

## 2022-06-24 LAB — TYPE AND SCREEN
ABO/RH(D): B POS
Antibody Screen: NEGATIVE
Unit division: 0
Unit division: 0

## 2022-06-24 LAB — BPAM RBC
Blood Product Expiration Date: 202401302359
Blood Product Expiration Date: 202402132359
ISSUE DATE / TIME: 202401152057
ISSUE DATE / TIME: 202401160125
Unit Type and Rh: 5100
Unit Type and Rh: 5100

## 2023-02-12 ENCOUNTER — Inpatient Hospital Stay (HOSPITAL_BASED_OUTPATIENT_CLINIC_OR_DEPARTMENT_OTHER)
Admission: EM | Admit: 2023-02-12 | Discharge: 2023-02-17 | DRG: 812 | Disposition: A | Discharge status: Home / Self Care | Payer: BLUE CROSS/BLUE SHIELD | Attending: Internal Medicine | Admitting: Internal Medicine

## 2023-02-12 ENCOUNTER — Other Ambulatory Visit: Payer: Self-pay

## 2023-02-12 ENCOUNTER — Encounter (HOSPITAL_COMMUNITY): Payer: Self-pay | Admitting: Family Medicine

## 2023-02-12 ENCOUNTER — Emergency Department (HOSPITAL_BASED_OUTPATIENT_CLINIC_OR_DEPARTMENT_OTHER): Payer: BLUE CROSS/BLUE SHIELD

## 2023-02-12 DIAGNOSIS — D72829 Elevated white blood cell count, unspecified: Secondary | ICD-10-CM | POA: Diagnosis present

## 2023-02-12 DIAGNOSIS — D638 Anemia in other chronic diseases classified elsewhere: Secondary | ICD-10-CM | POA: Diagnosis present

## 2023-02-12 DIAGNOSIS — Z1152 Encounter for screening for COVID-19: Secondary | ICD-10-CM

## 2023-02-12 DIAGNOSIS — D5700 Hb-SS disease with crisis, unspecified: Principal | ICD-10-CM | POA: Diagnosis present

## 2023-02-12 DIAGNOSIS — D649 Anemia, unspecified: Secondary | ICD-10-CM | POA: Diagnosis present

## 2023-02-12 DIAGNOSIS — M79661 Pain in right lower leg: Secondary | ICD-10-CM | POA: Diagnosis not present

## 2023-02-12 DIAGNOSIS — Z79899 Other long term (current) drug therapy: Secondary | ICD-10-CM

## 2023-02-12 DIAGNOSIS — Z791 Long term (current) use of non-steroidal anti-inflammatories (NSAID): Secondary | ICD-10-CM | POA: Diagnosis not present

## 2023-02-12 LAB — BASIC METABOLIC PANEL
Anion gap: 11 (ref 5–15)
BUN: 7 mg/dL (ref 6–20)
CO2: 21 mmol/L — ABNORMAL LOW (ref 22–32)
Calcium: 9.3 mg/dL (ref 8.9–10.3)
Chloride: 104 mmol/L (ref 98–111)
Creatinine, Ser: 0.56 mg/dL (ref 0.44–1.00)
GFR, Estimated: >60 mL/min (ref >60)
Glucose, Bld: 115 mg/dL — ABNORMAL HIGH (ref 70–99)
Potassium: 3.8 mmol/L (ref 3.5–5.1)
Sodium: 136 mmol/L (ref 135–145)

## 2023-02-12 LAB — RETICULOCYTES
Immature Retic Fract: 35.9 % — ABNORMAL HIGH (ref 2.3–15.9)
RBC.: 2.11 MIL/uL — ABNORMAL LOW (ref 3.87–5.11)
Retic Count, Absolute: 193.9 10*3/uL — ABNORMAL HIGH (ref 19.0–186.0)
Retic Ct Pct: 9.2 % — ABNORMAL HIGH (ref 0.4–3.1)

## 2023-02-12 LAB — CBC WITH DIFFERENTIAL/PLATELET
Abs Immature Granulocytes: 0.67 10*3/uL — ABNORMAL HIGH (ref 0.00–0.07)
Basophils Absolute: 0.1 10*3/uL (ref 0.0–0.1)
Basophils Relative: 0 %
Eosinophils Absolute: 0.4 10*3/uL (ref 0.0–0.5)
Eosinophils Relative: 3 %
HCT: 20.6 % — ABNORMAL LOW (ref 36.0–46.0)
Hemoglobin: 7.4 g/dL — ABNORMAL LOW (ref 12.0–15.0)
Immature Granulocytes: 4 %
Lymphocytes Relative: 23 %
Lymphs Abs: 3.6 10*3/uL (ref 0.7–4.0)
MCH: 35.2 pg — ABNORMAL HIGH (ref 26.0–34.0)
MCHC: 35.9 g/dL (ref 30.0–36.0)
MCV: 98.1 fL (ref 80.0–100.0)
Monocytes Absolute: 1.1 10*3/uL — ABNORMAL HIGH (ref 0.1–1.0)
Monocytes Relative: 7 %
Neutro Abs: 9.6 10*3/uL — ABNORMAL HIGH (ref 1.7–7.7)
Neutrophils Relative %: 63 %
Platelets: 332 10*3/uL (ref 150–400)
RBC: 2.1 MIL/uL — ABNORMAL LOW (ref 3.87–5.11)
RDW: 21.4 % — ABNORMAL HIGH (ref 11.5–15.5)
Smear Review: NORMAL
WBC: 15.3 10*3/uL — ABNORMAL HIGH (ref 4.0–10.5)
nRBC: 26.3 % — ABNORMAL HIGH (ref 0.0–0.2)

## 2023-02-12 LAB — URINALYSIS, ROUTINE W REFLEX MICROSCOPIC
Bilirubin Urine: NEGATIVE
Glucose, UA: NEGATIVE mg/dL
Hgb urine dipstick: NEGATIVE
Ketones, ur: NEGATIVE mg/dL
Leukocytes,Ua: NEGATIVE
Nitrite: NEGATIVE
Protein, ur: NEGATIVE mg/dL
Specific Gravity, Urine: 1.02 (ref 1.005–1.030)
pH: 7.5 (ref 5.0–8.0)

## 2023-02-12 LAB — SARS CORONAVIRUS 2 BY RT PCR: SARS Coronavirus 2 by RT PCR: NEGATIVE

## 2023-02-12 LAB — PREGNANCY, URINE: Preg Test, Ur: NEGATIVE

## 2023-02-12 LAB — TROPONIN I (HIGH SENSITIVITY): Troponin I (High Sensitivity): 6 ng/L (ref <18)

## 2023-02-12 MED ORDER — ACETAMINOPHEN 325 MG PO TABS
650.0000 mg | ORAL_TABLET | Freq: Once | ORAL | Status: AC
  Administered 2023-02-12: 650 mg via ORAL
  Filled 2023-02-12: qty 2

## 2023-02-12 MED ORDER — HYDROMORPHONE HCL 1 MG/ML IJ SOLN
1.0000 mg | Freq: Once | INTRAMUSCULAR | Status: AC
  Administered 2023-02-12: 1 mg via INTRAVENOUS
  Filled 2023-02-12: qty 1

## 2023-02-12 MED ORDER — CELECOXIB 100 MG PO CAPS: 100.0000 mg | ORAL_CAPSULE | Freq: Every day | ORAL | Status: DC

## 2023-02-12 MED ORDER — PENICILLIN V POTASSIUM 250 MG PO TABS
250.0000 mg | ORAL_TABLET | Freq: Two times a day (BID) | ORAL | Status: DC
  Administered 2023-02-12 – 2023-02-17 (×10): 250 mg via ORAL
  Filled 2023-02-12 (×12): qty 1

## 2023-02-12 MED ORDER — FAMOTIDINE 20 MG PO TABS
20.0000 mg | ORAL_TABLET | Freq: Every day | ORAL | Status: DC
  Administered 2023-02-13 – 2023-02-17 (×5): 20 mg via ORAL
  Filled 2023-02-12 (×5): qty 1

## 2023-02-12 MED ORDER — SODIUM CHLORIDE 0.9% FLUSH
9.0000 mL | INTRAVENOUS | Status: DC | PRN
  Administered 2023-02-14: 9 mL via INTRAVENOUS

## 2023-02-12 MED ORDER — DIPHENHYDRAMINE HCL 50 MG/ML IJ SOLN: 12.5000 mg | Freq: Four times a day (QID) | INTRAMUSCULAR | Status: DC | PRN

## 2023-02-12 MED ORDER — VITAMIN D 25 MCG (1000 UNIT) PO TABS
1000.0000 [IU] | ORAL_TABLET | Freq: Every day | ORAL | Status: DC
  Administered 2023-02-13 – 2023-02-17 (×5): 1000 [IU] via ORAL
  Filled 2023-02-12 (×5): qty 1

## 2023-02-12 MED ORDER — ENOXAPARIN SODIUM 40 MG/0.4ML IJ SOSY
40.0000 mg | PREFILLED_SYRINGE | INTRAMUSCULAR | Status: DC
  Administered 2023-02-13 – 2023-02-14 (×2): 40 mg via SUBCUTANEOUS
  Filled 2023-02-12 (×4): qty 0.4

## 2023-02-12 MED ORDER — LISINOPRIL 5 MG PO TABS
5.0000 mg | ORAL_TABLET | Freq: Every day | ORAL | Status: DC
  Administered 2023-02-12 – 2023-02-17 (×5): 5 mg via ORAL
  Filled 2023-02-12 (×6): qty 1

## 2023-02-12 MED ORDER — HYDROMORPHONE HCL 1 MG/ML IJ SOLN
1.0000 mg | INTRAMUSCULAR | Status: AC | PRN
  Administered 2023-02-12 (×2): 1 mg via INTRAVENOUS
  Filled 2023-02-12 (×2): qty 1

## 2023-02-12 MED ORDER — NALOXONE HCL 0.4 MG/ML IJ SOLN: 0.4000 mg | INTRAMUSCULAR | Status: DC | PRN

## 2023-02-12 MED ORDER — SODIUM CHLORIDE 0.45 % IV SOLN: INTRAVENOUS | Status: DC

## 2023-02-12 MED ORDER — KETOROLAC TROMETHAMINE 15 MG/ML IJ SOLN
15.0000 mg | INTRAMUSCULAR | Status: AC
  Administered 2023-02-12: 15 mg via INTRAVENOUS
  Filled 2023-02-12: qty 1

## 2023-02-12 MED ORDER — KETOROLAC TROMETHAMINE 15 MG/ML IJ SOLN
15.0000 mg | Freq: Four times a day (QID) | INTRAMUSCULAR | Status: AC
  Administered 2023-02-12 – 2023-02-17 (×20): 15 mg via INTRAVENOUS
  Filled 2023-02-12 (×20): qty 1

## 2023-02-12 MED ORDER — ONDANSETRON HCL 4 MG/2ML IJ SOLN
4.0000 mg | INTRAMUSCULAR | Status: DC | PRN
  Administered 2023-02-12 – 2023-02-14 (×3): 4 mg via INTRAVENOUS
  Filled 2023-02-12 (×3): qty 2

## 2023-02-12 MED ORDER — HYDROXYUREA 500 MG PO CAPS
1500.0000 mg | ORAL_CAPSULE | Freq: Every day | ORAL | Status: DC
  Administered 2023-02-12 – 2023-02-15 (×4): 1500 mg via ORAL
  Filled 2023-02-12 (×4): qty 3

## 2023-02-12 MED ORDER — SENNOSIDES-DOCUSATE SODIUM 8.6-50 MG PO TABS
1.0000 | ORAL_TABLET | Freq: Two times a day (BID) | ORAL | Status: DC
  Administered 2023-02-12 – 2023-02-17 (×10): 1 via ORAL
  Filled 2023-02-12 (×10): qty 1

## 2023-02-12 MED ORDER — ONDANSETRON HCL 4 MG/2ML IJ SOLN: 4.0000 mg | Freq: Four times a day (QID) | INTRAMUSCULAR | Status: DC | PRN

## 2023-02-12 MED ORDER — DIPHENHYDRAMINE HCL 12.5 MG/5ML PO ELIX: 12.5000 mg | ORAL_SOLUTION | Freq: Four times a day (QID) | ORAL | Status: DC | PRN

## 2023-02-12 MED ORDER — HYDROMORPHONE 1 MG/ML IV SOLN
INTRAVENOUS | Status: DC
  Administered 2023-02-12: 30 mg via INTRAVENOUS
  Administered 2023-02-12: 1.5 mg via INTRAVENOUS
  Administered 2023-02-13: 1.4 mg via INTRAVENOUS
  Administered 2023-02-13: 1.5 mg via INTRAVENOUS
  Administered 2023-02-13: 2.4 mg via INTRAVENOUS
  Administered 2023-02-13 (×2): 1 mg via INTRAVENOUS
  Administered 2023-02-13: 1.5 mg via INTRAVENOUS
  Administered 2023-02-14 (×2): 1 mg via INTRAVENOUS
  Administered 2023-02-14 – 2023-02-15 (×3): 0.2 mg via INTRAVENOUS
  Administered 2023-02-15: 1 mg via INTRAVENOUS
  Administered 2023-02-15: 0.4 mg via INTRAVENOUS
  Administered 2023-02-15: 1.4 mg via INTRAVENOUS
  Filled 2023-02-12: qty 30

## 2023-02-12 MED ORDER — POLYETHYLENE GLYCOL 3350 17 G PO PACK
17.0000 g | PACK | Freq: Every day | ORAL | Status: DC | PRN
  Administered 2023-02-15: 17 g via ORAL
  Filled 2023-02-12 (×3): qty 1

## 2023-02-12 MED ORDER — GABAPENTIN 300 MG PO CAPS
600.0000 mg | ORAL_CAPSULE | Freq: Three times a day (TID) | ORAL | Status: DC
  Administered 2023-02-12 – 2023-02-17 (×15): 600 mg via ORAL
  Filled 2023-02-12 (×15): qty 2

## 2023-02-12 MED ORDER — ACETAMINOPHEN 325 MG PO TABS
650.0000 mg | ORAL_TABLET | ORAL | Status: DC | PRN
  Administered 2023-02-13 – 2023-02-17 (×4): 650 mg via ORAL
  Filled 2023-02-12 (×4): qty 2

## 2023-02-12 MED ORDER — SODIUM CHLORIDE 0.9 % IV BOLUS
1000.0000 mL | Freq: Once | INTRAVENOUS | Status: AC
  Administered 2023-02-12: 1000 mL via INTRAVENOUS

## 2023-02-12 NOTE — ED Notes (Signed)
Called [facility] for admit at 9:32.

## 2023-02-12 NOTE — ED Provider Notes (Signed)
[place] [facility] Provider Note   CSN: [id number] Arrival date & time: 02/12/23  0730     History  Chief Complaint  Patient presents with   Sickle Cell Pain Crisis    Sarah Patterson is a 22 y.o. female with a history of sickle cell disease presented to ED complaining of acute onset of abdominal, chest and back pain.  The patient reports her sickle cell generally well-controlled.  Her last hospitalization was in January.  She states she takes Tylenol as needed and oxycodone for breakthrough pain at home.  Last night she began having severe pain, which is mostly in her chest, her back, her shoulders, also her abdomen.  She says some of these elements are consistent with her sickle cell pain but some of the back pain feels different to her as well.  She denies any recent fevers, chills, coughing, congestion.  She denies any other medical issues.  Patient reporting excessive alcohol consumption last night.  I reviewed her external records.  Her last hospitalization was in January 2024.  At that time she was anemic with an initial hemoglobin of 5.6, prompting 2 units red blood cell transfusion during hospitalization.  She is adherent with her Hydrea  HPI     Home Medications Prior to Admission medications   Medication Sig Start Date End Date Taking? Authorizing Provider  acetaminophen (TYLENOL) 500 MG tablet Take 500-1,000 mg by mouth every 6 (six) hours as needed for mild pain, moderate pain or headache.   Yes [provider]  ALEVE 220 MG tablet Take 220-440 mg by mouth as needed (for pain, headaches).   Yes [provider]  celecoxib (CELEBREX) 100 MG capsule Take 100 mg by mouth daily. 01/02/21  Yes [provider]  Cholecalciferol (VITAMIN D3) 1000 units CAPS Take 1,000 Units by mouth daily.   Yes [provider]  famotidine (PEPCID) 20 MG tablet Take 20 mg by mouth daily. 06/11/22  Yes [provider]   gabapentin (NEURONTIN) 300 MG capsule Take 600 mg by mouth 3 (three) times daily. 02/17/21  Yes [provider]  hydroxyurea (HYDREA) 500 MG capsule Take 1,500 mg by mouth at bedtime. 03/07/21  Yes [provider]  lisinopril (ZESTRIL) 5 MG tablet Take 5 mg by mouth daily. 02/17/21  Yes [provider]  magnesium oxide (MAG-OX) 400 MG tablet Take 400 mg by mouth daily. 05/26/17  Yes [provider]  ondansetron (ZOFRAN) 4 MG tablet Take 4 mg by mouth as needed for nausea or vomiting. 06/11/22  Yes [provider]  oxyCODONE (OXY IR/ROXICODONE) 5 MG immediate release tablet Take 5 mg by mouth as needed for severe pain (during a crisis). 01/02/21  Yes [provider]  penicillin v potassium (VEETID) 250 MG tablet Take 250 mg by mouth 2 (two) times daily. 01/27/21  Yes [provider]  SF 5000 PLUS 1.1 % CREA dental cream Place 1 Application onto teeth at bedtime. 06/04/22  Yes [provider]      Allergies    Patient has no known allergies.    Review of Systems   Review of Systems  Physical Exam Updated Vital Signs BP 109/64   Pulse 84   Temp 98.8 F (37.1 C) (Oral)   Resp 19   Wt 56.7 kg   SpO2 99%   BMI 23.62 kg/m  Physical Exam Constitutional:      General: She is in acute distress.  HENT:  Head: Normocephalic and atraumatic.  Eyes:     Conjunctiva/sclera: Conjunctivae normal.     Pupils: Pupils are equal, round, and reactive to light.  Cardiovascular:     Rate and Rhythm: Regular rhythm. Tachycardia present.  Pulmonary:     Effort: Pulmonary effort is normal. No respiratory distress.  Abdominal:     General: There is no distension.     Tenderness: There is no abdominal tenderness.  Skin:    General: Skin is warm and dry.  Neurological:     General: No focal deficit present.     Mental Status: She is alert. Mental status is at baseline.  Psychiatric:        Mood and Affect: Mood normal.         Behavior: Behavior normal.     ED Results / Procedures / Treatments   Labs (all labs ordered are listed, but only abnormal results are displayed) Labs Reviewed  RETICULOCYTES - Abnormal; Notable for the following components:      Result Value   Retic Ct Pct 9.2 (*)    RBC. 2.11 (*)    Retic Count, Absolute 193.9 (*)    Immature Retic Fract 35.9 (*)    All other components within normal limits  BASIC METABOLIC PANEL - Abnormal; Notable for the following components:   CO2 21 (*)    Glucose, Bld 115 (*)    All other components within normal limits  CBC WITH DIFFERENTIAL/PLATELET - Abnormal; Notable for the following components:   WBC 15.3 (*)    RBC 2.10 (*)    Hemoglobin 7.4 (*)    HCT 20.6 (*)    MCH 35.2 (*)    RDW 21.4 (*)    nRBC 26.3 (*)    Neutro Abs 9.6 (*)    Monocytes Absolute 1.1 (*)    Abs Immature Granulocytes 0.67 (*)    All other components within normal limits  SARS CORONAVIRUS 2 BY RT PCR  PREGNANCY, URINE  URINALYSIS, ROUTINE W REFLEX MICROSCOPIC  TROPONIN I (HIGH SENSITIVITY)    EKG EKG Interpretation Date/Time:  Friday February 12 2023 07:42:13 EDT Ventricular Rate:  102 PR Interval:  176 QRS Duration:  90 QT Interval:  364 QTC Calculation: 475 R Axis:   25  Text Interpretation: Sinus tachycardia Nonspecific T abnormalities, anterior leads Confirmed by Alvester Chou [phone]) on 02/12/2023 8:56:43 AM  Radiology DG Chest Port 1 View  Result Date: 02/12/2023 CLINICAL DATA:  sickle cell.  Acute chest pain EXAM: PORTABLE CHEST 1 VIEW COMPARISON:  06/15/2022. FINDINGS: Bilateral lung fields are clear. Bilateral costophrenic angles are clear. Stable cardio-mediastinal silhouette. No acute osseous abnormalities. The soft tissues are within normal limits. IMPRESSION: 1. No active disease. Electronically Signed   By: Jules Schick M.D.   On: 02/12/2023 08:55    Procedures .Critical Care  Performed by: Terald Sleeper, MD Authorized by: Terald Sleeper, MD   Critical care provider statement:    Critical care time (minutes):  45   Critical care time was exclusive of:  Separately billable procedures and treating other patients   Critical care was necessary to treat or prevent imminent or life-threatening deterioration of the following conditions:  Circulatory failure   Critical care was time spent personally by me on the following activities:  Ordering and performing treatments and interventions, ordering and review of laboratory studies, ordering and review of radiographic studies, pulse oximetry, review of old charts, examination of patient and evaluation of patient's response to  treatment   Care discussed with: admitting provider   Comments:     Sickle cell pain management     Medications Ordered in ED Medications  ondansetron (ZOFRAN) injection 4 mg (4 mg Intravenous Given 02/12/23 0804)  HYDROmorphone (DILAUDID) injection 1 mg (has no administration in time range)  ketorolac (TORADOL) 15 MG/ML injection 15 mg (15 mg Intravenous Given 02/12/23 0803)  HYDROmorphone (DILAUDID) injection 1 mg (1 mg Intravenous Given 02/12/23 0806)  sodium chloride 0.9 % bolus 1,000 mL (0 mLs Intravenous Stopped 02/12/23 0916)  HYDROmorphone (DILAUDID) injection 1 mg (1 mg Intravenous Given 02/12/23 0925)    ED Course/ Medical Decision Making/ A&P Clinical Course as of 02/12/23 0933  Fri Feb 12, 2023  0914 Additional pain medication ordered [MT]  0930 Spoke to Arm[place] Hollis who accepts patient for admission for sickle cell pain crisis - additional pain medications ordered for intractable pain. [MT]    Clinical Course User Index [MT] Lamaria Hildebrandt, Kermit Balo, MD                                 Medical Decision Making Amount and/or Complexity of Data Reviewed Labs: ordered. Radiology: ordered. ECG/medicine tests: ordered.  Risk Prescription drug management. Decision regarding hospitalization.   This patient presents to the ED with concern for  diffuse body pain. This involves an extensive number of treatment options, and is a complaint that carries with it a high risk of complications and morbidity.  The differential diagnosis includes sickle cell crisis versus acute chest syndrome versus pneumonia versus other  Co-morbidities that complicate the patient evaluation: History of sickle cell disease at higher risk of vascular complication  External records from outside source obtained and reviewed including hospitalization discharge record from January of this year  I ordered and personally interpreted labs.  The pertinent results include: Hgb 7.4, Retic 9.2, Wbc 15.3, BMP unremarkable; covid negative; trop 6  I ordered imaging studies including x-ray of the chest I independently visualized and interpreted imaging which showed no emergent findings I agree with the radiologist interpretation  The patient was maintained on a cardiac monitor.  I personally viewed and interpreted the cardiac monitored which showed an underlying rhythm of: Sinus rhythm and sinus tachycardia  Per my interpretation the patient's ECG shows mild sinus tachycardia with heart rate of 102, some lateral lead baseline wander, nonspecific T wave inversions in the anterior leads, no acute ischemic findings  I ordered medication including IV fluids, IV pain and nausea medicine for suspected sickle cell pain crisis  I have reviewed the patients home medicines and have made adjustments as needed  Test Considered: I would lower suspicion for acute PE in this clinical setting.  The patient does not have acute PE risk factors, has no hypoxia.  I do not see an indication for CT angiogram at this time.  After the interventions noted above, I reevaluated the patient and found that they have: stayed the same   Dispostion:  After consideration of the diagnostic results and the patients response to treatment, I feel that the patent would benefit from  hospitalization.  Overall this presentation is most consistent with a sickle cell pain crisis triggered likely by excessive alcohol consumption last night.  I do not see evidence of acute chest, infection, or sepsis.  The patient does have a leukocytosis which may be reactive in the setting of her pain, but no fever.  Her reticulocyte count  is elevated and her hemoglobin is mildly lower than normal, consistent with her sickle cell crisis, but there is no immediate indication for blood transfusion at this time.  The case was discussed by phone of the sickle cell team and the patient will be admitted.  The patient is in agreement for admission.  The patient's friend was present for the entire history and exam, with the patient's permission.         Final Clinical Impression(s) / ED Diagnoses Final diagnoses:  Sickle cell pain crisis (HCC)  Anemia, unspecified type    Rx / DC Orders ED Discharge Orders     None         Benjamyn Hestand, Kermit Balo, MD 02/12/23 [phone]

## 2023-02-12 NOTE — ED Triage Notes (Signed)
Pt c/o sickle cell pain crisis that began last night. Pt states pain is in her chest, back, shoulders. Unrelieved by home PO medications.

## 2023-02-12 NOTE — ED Notes (Signed)
Pt placed on 2 lpm O2 via [place] after desaturating to 87% on RA post medication administration.

## 2023-02-12 NOTE — ED Notes (Signed)
Called [facility] for transport at 2:15.

## 2023-02-12 NOTE — ED Notes (Signed)
ED TO INPATIENT HANDOFF REPORT  ED Nurse Name and Phone #: Cat [phone]  S Name/Age/Gender Sarah Patterson 22 y.o. female Room/Bed: MH05/MH05  Code Status   Code Status: Prior  Home/SNF/Other Home Patient oriented to: self, place, time, and situation Is this baseline? Yes   Triage Complete: Triage complete  Chief Complaint Sickle cell pain crisis (HCC) [D57.00]  Triage Note Pt c/o sickle cell pain crisis that began last night. Pt states pain is in her chest, back, shoulders. Unrelieved by home PO medications.    Allergies No Known Allergies  Level of Care/Admitting Diagnosis ED Disposition     ED Disposition  Admit   Condition  --   Comment  Hospital Area: [facility] [[id number]]  Level of Care: Med-Surg [16]  May admit patient to [facility] or [facility] if equivalent level of care is available:: No  Interfacility transfer: Yes  Covid Evaluation: Confirmed COVID Negative  Diagnosis: Sickle cell pain crisis [facility]) [[id number]]  Admitting Physician: Terald Sleeper [[id number]]  Attending Physician: Quentin Angst [[id number]]  Certification:: I certify this patient will need inpatient services for at least 2 midnights  Expected Medical Readiness: 02/14/2023          B Medical/Surgery History Past Medical History:  Diagnosis Date   Sickle cell anemia (HCC)    No past surgical history on file.   A IV Location/Drains/Wounds Patient Lines/Drains/Airways Status     Active Line/Drains/Airways     Name Placement date Placement time Site Days   Peripheral IV 02/12/23 20 G 1.88" Anterior;Distal;Right;Upper Arm 02/12/23  0757  Arm  less than 1            Intake/Output Last 24 hours No intake or output data in the 24 hours ending 02/12/23 1217  Labs/Imaging Results for orders placed or performed during the hospital encounter of 02/12/23 (from the past 48 hour(s))  Reticulocytes     Status: Abnormal   Collection Time: 02/12/23  7:55 AM   Result Value Ref Range   Retic Ct Pct 9.2 (H) 0.4 - 3.1 %   RBC. 2.11 (L) 3.87 - 5.11 MIL/uL   Retic Count, Absolute 193.9 (H) 19.0 - 186.0 K/uL   Immature Retic Fract 35.9 (H) 2.3 - 15.9 %    Comment: Performed at [facility], [address] [place], [place] [address]  Basic metabolic panel     Status: Abnormal   Collection Time: 02/12/23  7:55 AM  Result Value Ref Range   Sodium 136 135 - 145 mmol/L   Potassium 3.8 3.5 - 5.1 mmol/L   Chloride 104 98 - 111 mmol/L   CO2 21 (L) 22 - 32 mmol/L   Glucose, Bld 115 (H) 70 - 99 mg/dL    Comment: Glucose reference range applies only to samples taken after fasting for at least 8 hours.   BUN 7 6 - 20 mg/dL   Creatinine, Ser 8.84 0.44 - 1.00 mg/dL   Calcium 9.3 8.9 - 16.6 mg/dL   GFR, Estimated >06 >30 mL/min    Comment: (NOTE) Calculated using the CKD-EPI Creatinine Equation (2021)    Anion gap 11 5 - 15    Comment: Performed at [facility], [address] [place], [place] [address]  CBC WITH DIFFERENTIAL     Status: Abnormal   Collection Time: 02/12/23  7:55 AM  Result Value Ref Range   WBC 15.3 (H) 4.0 - 10.5  K/uL    Comment: REPEATED TO VERIFY WHITE COUNT CONFIRMED ON SMEAR    RBC 2.10 (L) 3.87 - 5.11 MIL/uL   Hemoglobin 7.4 (L) 12.0 - 15.0 g/dL   HCT 09.6 (L) 04.5 - 40.9 %   MCV 98.1 80.0 - 100.0 fL   MCH 35.2 (H) 26.0 - 34.0 pg   MCHC 35.9 30.0 - 36.0 g/dL   RDW 81.1 (H) 91.4 - 78.2 %   Platelets 332 150 - 400 K/uL   nRBC 26.3 (H) 0.0 - 0.2 %   Neutrophils Relative % 63 %   Neutro Abs 9.6 (H) 1.7 - 7.7 K/uL   Lymphocytes Relative 23 %   Lymphs Abs 3.6 0.7 - 4.0 K/uL   Monocytes Relative 7 %   Monocytes Absolute 1.1 (H) 0.1 - 1.0 K/uL   Eosinophils Relative 3 %   Eosinophils Absolute 0.4 0.0 - 0.5 K/uL   Basophils Relative 0 %   Basophils Absolute 0.1 0.0 - 0.1 K/uL   WBC Morphology MORPHOLOGY UNREMARKABLE    Smear Review Normal platelet morphology     Comment:  PLATELETS APPEAR ADEQUATE   Immature Granulocytes 4 %   Abs Immature Granulocytes 0.67 (H) 0.00 - 0.07 K/uL   Polychromasia PRESENT    Sickle Cells MARKED    Target Cells PRESENT     Comment: Performed at [facility], [address] [place], [place] [address]  Troponin I (High Sensitivity)     Status: None   Collection Time: 02/12/23  7:55 AM  Result Value Ref Range   Troponin I (High Sensitivity) 6 <18 ng/L    Comment: (NOTE) Elevated high sensitivity troponin I (hsTnI) values and significant  changes across serial measurements may suggest ACS but many other  chronic and acute conditions are known to elevate hsTnI results.  Refer to the "Links" section for chest pain algorithms and additional  guidance. Performed at [facility], [address] [place], [place] [address]   SARS Coronavirus 2 by RT PCR (hospital order, performed in [facility] hospital lab) *cepheid single result test* Anterior Nasal Swab     Status: None   Collection Time: 02/12/23  7:59 AM   Specimen: Anterior Nasal Swab  Result Value Ref Range   SARS Coronavirus 2 by RT PCR NEGATIVE NEGATIVE    Comment: (NOTE) SARS-CoV-2 target nucleic acids are NOT DETECTED.  The SARS-CoV-2 RNA is generally detectable in upper and lower respiratory specimens during the acute phase of infection. The lowest concentration of SARS-CoV-2 viral copies this assay can detect is 250 copies / mL. A negative result does not preclude SARS-CoV-2 infection and should not be used as the sole basis for treatment or other patient management decisions.  A negative result may occur with improper specimen collection / handling, submission of specimen other than nasopharyngeal swab, presence of viral mutation(s) within the areas targeted by this assay, and inadequate number of viral copies (<250 copies / mL). A negative result must be combined with clinical observations, patient history, and epidemiological  information.  Fact Sheet for Patients:   RoadLapTop.co.za  Fact Sheet for Healthcare Providers: http://kim-miller.com/  This test is not yet approved or  cleared by the [place] FDA and has been authorized for detection and/or diagnosis of SARS-CoV-2 by FDA under an Emergency Use Authorization (EUA).  This EUA will remain in effect (meaning this test can be used) for the duration of the COVID-19 declaration under Section 564(b)(1) of the Act,  21 U.S.C. section 360bbb-3(b)(1), unless the authorization is terminated or revoked sooner.  Performed at [facility], [address] [place], [place] [address]   Pregnancy, urine     Status: None   Collection Time: 02/12/23 10:08 AM  Result Value Ref Range   Preg Test, Ur NEGATIVE NEGATIVE    Comment:        THE SENSITIVITY OF THIS METHODOLOGY IS >25 mIU/mL. Performed at [facility], [address] [place], [place] [address]   Urinalysis, Routine w reflex microscopic -Urine, Clean Catch     Status: None   Collection Time: 02/12/23 10:08 AM  Result Value Ref Range   Color, Urine YELLOW YELLOW   APPearance CLEAR CLEAR   Specific Gravity, Urine 1.020 1.005 - 1.030   pH 7.5 5.0 - 8.0   Glucose, UA NEGATIVE NEGATIVE mg/dL   Hgb urine dipstick NEGATIVE NEGATIVE   Bilirubin Urine NEGATIVE NEGATIVE   Ketones, ur NEGATIVE NEGATIVE mg/dL   Protein, ur NEGATIVE NEGATIVE mg/dL   Nitrite NEGATIVE NEGATIVE   Leukocytes,Ua NEGATIVE NEGATIVE    Comment: Microscopic not done on urines with negative protein, blood, leukocytes, nitrite, or glucose < 500 mg/dL. Performed at [facility], [address] [place], [place] [address]    DG Chest [place] 1 View  Result Date: 02/12/2023 CLINICAL DATA:  sickle cell.  Acute chest pain EXAM: PORTABLE CHEST 1 VIEW COMPARISON:  06/15/2022. FINDINGS: Bilateral lung fields are clear. Bilateral costophrenic angles are clear. Stable  cardio-mediastinal silhouette. No acute osseous abnormalities. The soft tissues are within normal limits. IMPRESSION: 1. No active disease. Electronically Signed   By: Jules Schick M.D.   On: 02/12/2023 08:55    Pending Labs Unresulted Labs (From admission, onward)    None       Vitals/Pain Today's Vitals   02/12/23 0833 02/12/23 0900 02/12/23 1003 02/12/23 1141  BP:  109/64    Pulse:  84    Resp:  19    Temp:    98.3 F (36.8 C)  TempSrc:    Oral  SpO2:  99%    Weight:      PainSc: 10-Worst pain ever  Asleep     Isolation Precautions No active isolations  Medications Medications  ondansetron (ZOFRAN) injection 4 mg (4 mg Intravenous Given 02/12/23 0804)  HYDROmorphone (DILAUDID) injection 1 mg (1 mg Intravenous Given 02/12/23 1212)  ketorolac (TORADOL) 15 MG/ML injection 15 mg (15 mg Intravenous Given 02/12/23 0803)  HYDROmorphone (DILAUDID) injection 1 mg (1 mg Intravenous Given 02/12/23 0806)  sodium chloride 0.9 % bolus 1,000 mL (0 mLs Intravenous Stopped 02/12/23 0916)  HYDROmorphone (DILAUDID) injection 1 mg (1 mg Intravenous Given 02/12/23 0925)    Mobility walks     Focused Assessments    R Recommendations: See Admitting Provider Note  Report given to:   Additional Notes:

## 2023-02-12 NOTE — H&P (Signed)
H&P  Patient Demographics:  Sarah Patterson, is a 22 y.o. female  MRN: [id number]   DOB - 12-13-2000  Admit Date - 02/12/2023  Outpatient Primary MD for the patient is Pcp, No  Chief Complaint  Patient presents with   Sickle Cell Pain Crisis      HPI:   Sarah Patterson  is a 22 y.o. female with a medical history significant for sickle cell disease and anemia of chronic disease presented to [facility] with complaints of pain to chest, low back, and lower extremities.  Pain started around 1145 last night.  Patient attributes pain crisis to going out drinking with friends.  Patient states that she took Tylenol without very much relief and transition to her home oxycodone.  Patient currently rates pain as 9/10 mostly to her bilateral lower extremities.  She denies any headache, blurry vision, dizziness.  No nausea, vomiting, or diarrhea.  No sick contacts or recent. ER Course:  Vital signs show: BP 115/75   Pulse (!) 102   Temp 100 F (37.8 C) (Oral)   Resp (!) 34   Wt 56.7 kg   SpO2 98%   BMI 23.62 kg/m Complete blood count notable for WBCs 15.3, hemoglobin 7.4 g/dL, and platelets 366,440.  Basic metabolic panel unremarkable.  Chest x-ray shows no active cardiopulmonary disease.  Pain persists despite IV fluids, and IV Dilaudid.  Patient admitted to [facility] for further management of sickle cell pain crisis.   Review of systems:  Review of Systems  Constitutional:  Positive for malaise/fatigue. Negative for chills and fever.  HENT: Negative.    Eyes: Negative.   Respiratory: Negative.    Cardiovascular: Negative.   Gastrointestinal: Negative.   Genitourinary: Negative.   Musculoskeletal:  Positive for back pain and joint pain.  Skin: Negative.   Neurological: Negative.   Psychiatric/Behavioral: Negative.       With Past History of the following :   Past Medical History:  Diagnosis Date   Sickle cell anemia (HCC)       No past surgical history on file.    Social History:   Social History   Tobacco Use   Smoking status: Not on file   Smokeless tobacco: Not on file  Substance Use Topics   Alcohol use: Not on file     Lives - At home   Family History :   No family history on file.   Home Medications:   Prior to Admission medications   Medication Sig Start Date End Date Taking? Authorizing Provider  acetaminophen (TYLENOL) 500 MG tablet Take 500-1,000 mg by mouth every 6 (six) hours as needed for mild pain, moderate pain or headache.   Yes [provider]  ALEVE 220 MG tablet Take 220-440 mg by mouth as needed (for pain, headaches).   Yes [provider]  celecoxib (CELEBREX) 100 MG capsule Take 100 mg by mouth daily. 01/02/21  Yes [provider]  Cholecalciferol (VITAMIN D3) 1000 units CAPS Take 1,000 Units by mouth daily.   Yes [provider]  famotidine (PEPCID) 20 MG tablet Take 20 mg by mouth daily. 06/11/22  Yes [provider]  gabapentin (NEURONTIN) 300 MG capsule Take 600 mg by mouth 3 (three) times daily. 02/17/21  Yes [provider]  hydroxyurea (HYDREA) 500 MG capsule Take 1,500 mg by mouth at bedtime. 03/07/21  Yes [provider]  lisinopril (ZESTRIL) 5 MG tablet Take 5 mg by mouth daily. 02/17/21  Yes [provider]  magnesium oxide (MAG-OX) 400 MG tablet Take 400 mg by mouth daily. 05/26/17  Yes [provider]  ondansetron (ZOFRAN) 4 MG tablet Take 4 mg by mouth as needed for nausea or vomiting. 06/11/22  Yes [provider]  oxyCODONE (OXY IR/ROXICODONE) 5 MG immediate release tablet Take 5 mg by mouth as needed for severe pain (during a crisis). 01/02/21  Yes [provider]  penicillin v potassium (VEETID) 250 MG tablet Take 250 mg by mouth 2 (two) times daily. 01/27/21  Yes [provider]  SF 5000 PLUS 1.1 % CREA dental cream Place 1 Application onto teeth at bedtime. 06/04/22  Yes [provider]      Allergies:   No Known Allergies   Physical Exam:   Vitals:   Vitals:   02/12/23 1500 02/12/23 1509  BP: 115/75   Pulse: (!) 102   Resp: (!) 34   Temp:  100 F (37.8 C)  SpO2: 98%     Physical Exam: Constitutional: Patient appears well-developed and well-nourished. Not in obvious distress. HENT: Normocephalic, atraumatic, External right and left ear normal. Oropharynx is clear and moist.  Eyes: Conjunctivae and EOM are normal. PERRLA, no scleral icterus. Neck: Normal ROM. Neck supple. No JVD. No tracheal deviation. No thyromegaly. CVS: RRR, S1/S2 +, no murmurs, no gallops, no carotid bruit.  Pulmonary: Effort and breath sounds normal, no stridor, rhonchi, wheezes, rales.  Abdominal: Soft. BS +, no distension, tenderness, rebound or guarding.  Musculoskeletal: Normal range of motion. No edema and no tenderness.  Lymphadenopathy: No lymphadenopathy noted, cervical, inguinal or axillary Neuro: Alert. Normal reflexes, muscle tone coordination. No cranial nerve deficit. Skin: Skin is warm and dry. No rash noted. Not diaphoretic. No erythema. No pallor. Psychiatric: Normal mood and affect. Behavior, judgment, thought content normal.   Data Review:   CBC Recent Labs  Lab 02/12/23 0755  WBC 15.3*  HGB 7.4*  HCT 20.6*  PLT 332  MCV 98.1  MCH 35.2*  MCHC 35.9  RDW 21.4*  LYMPHSABS 3.6  MONOABS 1.1*  EOSABS 0.4  BASOSABS 0.1   ------------------------------------------------------------------------------------------------------------------  Chemistries  Recent Labs  Lab 02/12/23 0755  NA 136  K 3.8  CL 104  CO2 21*  GLUCOSE 115*  BUN 7  CREATININE 0.56  CALCIUM 9.3   ------------------------------------------------------------------------------------------------------------------ CrCl cannot be calculated (Unknown ideal weight.). ------------------------------------------------------------------------------------------------------------------ No results for  input(s): "TSH", "T4TOTAL", "T3FREE", "THYROIDAB" in the last 72 hours.  Invalid input(s): "FREET3"  Coagulation profile No results for input(s): "INR", "PROTIME" in the last 168 hours. ------------------------------------------------------------------------------------------------------------------- No results for input(s): "DDIMER" in the last 72 hours. -------------------------------------------------------------------------------------------------------------------  Cardiac Enzymes No results for input(s): "CKMB", "TROPONINI", "MYOGLOBIN" in the last 168 hours.  Invalid input(s): "CK" ------------------------------------------------------------------------------------------------------------------ No results found for: "BNP"  ---------------------------------------------------------------------------------------------------------------  Urinalysis    Component Value Date/Time   COLORURINE YELLOW 02/12/2023 1008   APPEARANCEUR CLEAR 02/12/2023 1008   LABSPEC 1.020 02/12/2023 1008   PHURINE 7.5 02/12/2023 1008   GLUCOSEU NEGATIVE 02/12/2023 1008   HGBUR NEGATIVE 02/12/2023 1008   BILIRUBINUR NEGATIVE 02/12/2023 1008   KETONESUR NEGATIVE 02/12/2023 1008   PROTEINUR NEGATIVE 02/12/2023 1008   NITRITE NEGATIVE 02/12/2023 1008   LEUKOCYTESUR NEGATIVE 02/12/2023 1008    ----------------------------------------------------------------------------------------------------------------   Imaging Results:    DG Chest Port 1 View  Result Date: 02/12/2023 CLINICAL DATA:  sickle cell.  Acute chest pain EXAM: PORTABLE CHEST 1 VIEW COMPARISON:  06/15/2022. FINDINGS: Bilateral lung fields are clear. Bilateral costophrenic angles are clear.  Stable cardio-mediastinal silhouette. No acute osseous abnormalities. The soft tissues are within normal limits. IMPRESSION: 1. No active disease. Electronically Signed   By: Jules Schick M.D.   On: 02/12/2023 08:55     Assessment & Plan:  Principal  Problem:   Sickle cell pain crisis (HCC) Active Problems:   Acute on chronic anemia   Leukocytosis  Sickle cell disease with pain crisis: Admit patient.  Continue IV fluids, 0.45% saline at 100 mL/h Partial dose PCA, patient opiate nave. Toradol 15 mg every 6 hours Percocet 5-325 mg mg every 4 hours as needed for severe breakthrough pain Continue folic acid and hydroxyurea Monitor vital signs very closely, reevaluate pain scale regularly, and supplemental oxygen.  Leukocytosis: WBCs 15.3.  Will continue to monitor closely.  Chest x-ray shows no active cardiopulmonary disease.  Urinalysis unremarkable.  No antibiotics at this time.  Monitor closely.  Labs in AM.  Anemia of chronic disease: Patient's hemoglobin is 7.4 g/dL, slightly below her baseline.  Review labs in a.m., if hemoglobin is less than 7 g/dL, consider transfusing 1 unit PRBCs.   DVT Prophylaxis: Subcut Lovenox   AM Labs Ordered, also please review Full Orders  Family Communication: Admission, patient's condition and plan of care including tests being ordered have been discussed with the patient who indicate understanding and agree with the plan and Code Status.  Code Status: Full Code  Consults called: None    Admission status: Inpatient    Time spent in minutes : 50 minutes  Nolon Nations  APRN, MSN, FNP-C Patient Care [facility] [address] [place], [place] [address] [phone]  02/12/2023 at 4:26 PM

## 2023-02-13 ENCOUNTER — Inpatient Hospital Stay (HOSPITAL_COMMUNITY): Payer: BLUE CROSS/BLUE SHIELD

## 2023-02-13 DIAGNOSIS — M79661 Pain in right lower leg: Secondary | ICD-10-CM

## 2023-02-13 DIAGNOSIS — D5700 Hb-SS disease with crisis, unspecified: Secondary | ICD-10-CM | POA: Diagnosis not present

## 2023-02-13 LAB — CBC
HCT: 20.6 % — ABNORMAL LOW (ref 36.0–46.0)
Hemoglobin: 7.4 g/dL — ABNORMAL LOW (ref 12.0–15.0)
MCH: 36.1 pg — ABNORMAL HIGH (ref 26.0–34.0)
MCHC: 35.9 g/dL (ref 30.0–36.0)
MCV: 100.5 fL — ABNORMAL HIGH (ref 80.0–100.0)
Platelets: 282 10*3/uL (ref 150–400)
RBC: 2.05 MIL/uL — ABNORMAL LOW (ref 3.87–5.11)
RDW: 21 % — ABNORMAL HIGH (ref 11.5–15.5)
WBC: 10.2 10*3/uL (ref 4.0–10.5)
nRBC: 46.4 % — ABNORMAL HIGH (ref 0.0–0.2)

## 2023-02-13 LAB — BASIC METABOLIC PANEL
Anion gap: 11 (ref 5–15)
BUN: 6 mg/dL (ref 6–20)
CO2: 23 mmol/L (ref 22–32)
Calcium: 8.9 mg/dL (ref 8.9–10.3)
Chloride: 102 mmol/L (ref 98–111)
Creatinine, Ser: 0.56 mg/dL (ref 0.44–1.00)
GFR, Estimated: >60 mL/min (ref >60)
Glucose, Bld: 111 mg/dL — ABNORMAL HIGH (ref 70–99)
Potassium: 3.8 mmol/L (ref 3.5–5.1)
Sodium: 136 mmol/L (ref 135–145)

## 2023-02-13 LAB — HIV ANTIBODY (ROUTINE TESTING W REFLEX): HIV Screen 4th Generation wRfx: NONREACTIVE

## 2023-02-13 MED ORDER — HYDROMORPHONE HCL 1 MG/ML IJ SOLN
0.5000 mg | Freq: Once | INTRAMUSCULAR | Status: AC
  Administered 2023-02-13: 0.5 mg via INTRAVENOUS
  Filled 2023-02-13: qty 0.5

## 2023-02-13 NOTE — Progress Notes (Signed)
Bilateral lower extremity venous duplex has been completed. Preliminary results can be found in CV Proc through chart review.   02/13/23 4:00 PM Olen Cordial RVT

## 2023-02-13 NOTE — Progress Notes (Signed)
SICKLE CELL SERVICE PROGRESS NOTE  Sarah Patterson NFA:[id number] DOB: 2001/05/17 DOA: 02/12/2023 PCP: Pcp, No  Assessment/Plan: Principal Problem:   Sickle cell pain crisis (HCC) Active Problems:   Acute on chronic anemia   Leukocytosis  Sickle cell pain crisis: Patient is opiate nave.  Initiated on Dilaudid PCA low-dose, Toradol and IV fluids.  Also on Percocet.  Patient seems to be doing better.  Will continue. Anemia of chronic disease: Hemoglobin appears to be at baseline.  No transfusion required.  Will continue to monitor Leukocytosis: Secondary to sickle cell crisis.  Continue to monitor.  Code Status: Full code Family Communication: No family at bedside Disposition Plan: Home  [facility]  Pager [phone]. If 7PM-7AM, please contact night-coverage.  02/13/2023, 7:15 PM  LOS: 1 day   Brief narrative: Sarah Patterson  is a 22 y.o. female with a medical history significant for sickle cell disease and anemia of chronic disease presented to [facility] with complaints of pain to chest, low back, and lower extremities.  Pain started around 1145 last night.  Patient attributes pain crisis to going out drinking with friends.  Patient states that she took Tylenol without very much relief and transition to her home oxycodone.  Patient currently rates pain as 9/10 mostly to her bilateral lower extremities.  She denies any headache, blurry vision, dizziness.  No nausea, vomiting, or diarrhea.  No sick contacts or recent.   Consultants: None  Procedures: None  Antibiotics: None  HPI/Subjective: Patient still has pain at 6 out of 10.  She was having significant pain overnight and was treated with additional bolus.  She is otherwise hemodynamically stable.  Objective: Vitals:   02/13/23 1238 02/13/23 1417 02/13/23 1600 02/13/23 1750  BP:  122/65  111/65  Pulse:  (!) 109  (!) 114  Resp: 18 17 18 16   Temp:  99.1 F (37.3 C)  (!) 100.5 F (38.1 C)  TempSrc:  Oral  Oral  SpO2:  97% 99% 100% 100%  Weight:      Height:       Weight change:   Intake/Output Summary (Last 24 hours) at 02/13/2023 1915 Last data filed at 02/13/2023 1800 Gross per 24 hour  Intake 769.83 ml  Output 900 ml  Net -130.17 ml    General: Alert, awake, oriented x3, in no acute distress.  HEENT: [place]/AT PEERL, EOMI Neck: Trachea midline,  no masses, no thyromegal,y no JVD, no carotid bruit OROPHARYNX:  Moist, No exudate/ erythema/lesions.  Heart: Regular rate and rhythm, without murmurs, rubs, gallops, PMI non-displaced, no heaves or thrills on palpation.  Lungs: Clear to auscultation, no wheezing or rhonchi noted. No increased vocal fremitus resonant to percussion  Abdomen: Soft, nontender, nondistended, positive bowel sounds, no masses no hepatosplenomegaly noted..  Neuro: No focal neurological deficits noted cranial nerves II through XII grossly intact. DTRs 2+ bilaterally upper and lower extremities. Strength 5 out of 5 in bilateral upper and lower extremities. Musculoskeletal: No warm swelling or erythema around joints, no spinal tenderness noted. Psychiatric: Patient alert and oriented x3, good insight and cognition, good recent to remote recall. Lymph node survey: No cervical axillary or inguinal lymphadenopathy noted.   Data Reviewed: Basic Metabolic Panel: Recent Labs  Lab 02/12/23 0755 02/13/23 0546  NA 136 136  K 3.8 3.8  CL 104 102  CO2 21* 23  GLUCOSE 115* 111*  BUN 7 6  CREATININE 0.56 0.56  CALCIUM 9.3 8.9   Liver Function Tests: No results for input(s): "AST", "  ALT", "ALKPHOS", "BILITOT", "PROT", "ALBUMIN" in the last 168 hours. No results for input(s): "LIPASE", "AMYLASE" in the last 168 hours. No results for input(s): "AMMONIA" in the last 168 hours. CBC: Recent Labs  Lab 02/12/23 0755 02/13/23 0546  WBC 15.3* 10.2  NEUTROABS 9.6*  --   HGB 7.4* 7.4*  HCT 20.6* 20.6*  MCV 98.1 100.5*  PLT 332 282   Cardiac Enzymes: No results for input(s): "CKTOTAL",  "CKMB", "CKMBINDEX", "TROPONINI" in the last 168 hours. BNP (last 3 results) No results for input(s): "BNP" in the last 8760 hours.  ProBNP (last 3 results) No results for input(s): "PROBNP" in the last 8760 hours.  CBG: No results for input(s): "GLUCAP" in the last 168 hours.  Recent Results (from the past 240 hour(s))  SARS Coronavirus 2 by RT PCR (hospital order, performed in [facility] hospital lab) *cepheid single result test* Anterior Nasal Swab     Status: None   Collection Time: 02/12/23  7:59 AM   Specimen: Anterior Nasal Swab  Result Value Ref Range Status   SARS Coronavirus 2 by RT PCR NEGATIVE NEGATIVE Final    Comment: (NOTE) SARS-CoV-2 target nucleic acids are NOT DETECTED.  The SARS-CoV-2 RNA is generally detectable in upper and lower respiratory specimens during the acute phase of infection. The lowest concentration of SARS-CoV-2 viral copies this assay can detect is 250 copies / mL. A negative result does not preclude SARS-CoV-2 infection and should not be used as the sole basis for treatment or other patient management decisions.  A negative result may occur with improper specimen collection / handling, submission of specimen other than nasopharyngeal swab, presence of viral mutation(s) within the areas targeted by this assay, and inadequate number of viral copies (<250 copies / mL). A negative result must be combined with clinical observations, patient history, and epidemiological information.  Fact Sheet for Patients:   RoadLapTop.co.za  Fact Sheet for Healthcare Providers: http://kim-miller.com/  This test is not yet approved or  cleared by the [place] FDA and has been authorized for detection and/or diagnosis of SARS-CoV-2 by FDA under an Emergency Use Authorization (EUA).  This EUA will remain in effect (meaning this test can be used) for the duration of the COVID-19 declaration under Section 564(b)(1)  of the Act, 21 U.S.C. section 360bbb-3(b)(1), unless the authorization is terminated or revoked sooner.  Performed at [facility], [address] [place], [place] [address]      Studies: VAS [place] LOWER EXTREMITY VENOUS (DVT)  Result Date: 02/13/2023  Lower Venous DVT Study Patient Name:  Sarah Patterson  Date of Exam:   02/13/2023 Medical Rec #: [id number]   Accession #:    [id number] Date of Birth: 2000-12-17  Patient Gender: F Patient Age:   21 years Exam Location:  [facility] Procedure:      VAS [place] LOWER EXTREMITY VENOUS (DVT) Referring Phys: ABIGAIL CHAVEZ --------------------------------------------------------------------------------  Indications: Pain.  Risk Factors: None identified. Limitations: Patient pain tolerance. Comparison Study: No prior studies. Performing Technologist: Chanda Busing RVT  Examination Guidelines: A complete evaluation includes B-mode imaging, spectral Doppler, color Doppler, and power Doppler as needed of all accessible portions of each vessel. Bilateral testing is considered an integral part of a complete examination. Limited examinations for reoccurring indications may be performed as noted. The reflux portion of the exam is performed with the patient in reverse Trendelenburg.  +---------+---------------+---------+-----------+----------+--------------+ RIGHT    CompressibilityPhasicitySpontaneityPropertiesThrombus Aging +---------+---------------+---------+-----------+----------+--------------+ CFV      Full  Yes      Yes                                 +---------+---------------+---------+-----------+----------+--------------+ SFJ      Full                                                        +---------+---------------+---------+-----------+----------+--------------+ FV Prox  Full                                                        +---------+---------------+---------+-----------+----------+--------------+ FV Mid    Full                                                        +---------+---------------+---------+-----------+----------+--------------+ FV DistalFull                                                        +---------+---------------+---------+-----------+----------+--------------+ PFV      Full                                                        +---------+---------------+---------+-----------+----------+--------------+ POP      Full           Yes      Yes                                 +---------+---------------+---------+-----------+----------+--------------+ PTV      Full                                                        +---------+---------------+---------+-----------+----------+--------------+ PERO     Full                                                        +---------+---------------+---------+-----------+----------+--------------+   +---------+---------------+---------+-----------+----------+--------------+ LEFT     CompressibilityPhasicitySpontaneityPropertiesThrombus Aging +---------+---------------+---------+-----------+----------+--------------+ CFV      Full           Yes      Yes                                 +---------+---------------+---------+-----------+----------+--------------+ SFJ  Full                                                        +---------+---------------+---------+-----------+----------+--------------+ FV Prox  Full                                                        +---------+---------------+---------+-----------+----------+--------------+ FV Mid   Full                                                        +---------+---------------+---------+-----------+----------+--------------+ FV DistalFull                                                        +---------+---------------+---------+-----------+----------+--------------+ PFV      Full                                                         +---------+---------------+---------+-----------+----------+--------------+ POP      Full           Yes      Yes                                 +---------+---------------+---------+-----------+----------+--------------+ PTV      Full                                                        +---------+---------------+---------+-----------+----------+--------------+ PERO     Full                                                        +---------+---------------+---------+-----------+----------+--------------+    Summary: RIGHT: - There is no evidence of deep vein thrombosis in the lower extremity.  - No cystic structure found in the popliteal fossa.  LEFT: - There is no evidence of deep vein thrombosis in the lower extremity.  - No cystic structure found in the popliteal fossa.  *See table(s) above for measurements and observations.    Preliminary    DG Chest Port 1 View  Result Date: 02/12/2023 CLINICAL DATA:  sickle cell.  Acute chest pain EXAM: PORTABLE CHEST 1 VIEW COMPARISON:  06/15/2022. FINDINGS: Bilateral lung fields are clear. Bilateral costophrenic angles are clear. Stable cardio-mediastinal silhouette. No acute osseous abnormalities. The soft tissues are within normal  limits. IMPRESSION: 1. No active disease. Electronically Signed   By: Jules Schick M.D.   On: 02/12/2023 08:55    Scheduled Meds:  cholecalciferol  1,000 Units Oral Daily   enoxaparin (LOVENOX) injection  40 mg Subcutaneous Q24H   famotidine  20 mg Oral Daily   gabapentin  600 mg Oral TID   HYDROmorphone   Intravenous Q4H   hydroxyurea  1,500 mg Oral QHS   ketorolac  15 mg Intravenous Q6H   lisinopril  5 mg Oral Daily   penicillin v potassium  250 mg Oral BID   senna-docusate  1 tablet Oral BID   Continuous Infusions:  sodium chloride 75 mL/hr at 02/13/23 1610    Principal Problem:   Sickle cell pain crisis (HCC) Active Problems:   Acute on chronic anemia    Leukocytosis

## 2023-02-13 NOTE — Progress Notes (Signed)
Patient ambulated to the bathroom & states it caused her extreme pain in her legs, radiating towards her back. Assessed legs, no signs of DVT present. Patient was yelling out in pain and crying. Contacted Anthoney Harada, NP for one time order for breakthrough pain. Medicine ordered and administered.

## 2023-02-14 DIAGNOSIS — D5700 Hb-SS disease with crisis, unspecified: Secondary | ICD-10-CM | POA: Diagnosis not present

## 2023-02-14 LAB — BASIC METABOLIC PANEL
Anion gap: 9 (ref 5–15)
BUN: 9 mg/dL (ref 6–20)
CO2: 24 mmol/L (ref 22–32)
Calcium: 8.4 mg/dL — ABNORMAL LOW (ref 8.9–10.3)
Chloride: 100 mmol/L (ref 98–111)
Creatinine, Ser: 0.52 mg/dL (ref 0.44–1.00)
GFR, Estimated: >60 mL/min (ref >60)
Glucose, Bld: 112 mg/dL — ABNORMAL HIGH (ref 70–99)
Potassium: 3.7 mmol/L (ref 3.5–5.1)
Sodium: 133 mmol/L — ABNORMAL LOW (ref 135–145)

## 2023-02-14 LAB — CBC
HCT: 18.3 % — ABNORMAL LOW (ref 36.0–46.0)
Hemoglobin: 6.5 g/dL — CL (ref 12.0–15.0)
MCH: 36.1 pg — ABNORMAL HIGH (ref 26.0–34.0)
MCHC: 35.5 g/dL (ref 30.0–36.0)
MCV: 101.7 fL — ABNORMAL HIGH (ref 80.0–100.0)
Platelets: 256 10*3/uL (ref 150–400)
RBC: 1.8 MIL/uL — ABNORMAL LOW (ref 3.87–5.11)
RDW: 19.6 % — ABNORMAL HIGH (ref 11.5–15.5)
WBC: 9.6 10*3/uL (ref 4.0–10.5)
nRBC: 31.9 % — ABNORMAL HIGH (ref 0.0–0.2)

## 2023-02-14 LAB — PREPARE RBC (CROSSMATCH)

## 2023-02-14 MED ORDER — SODIUM CHLORIDE 0.9% IV SOLUTION: Freq: Once | INTRAVENOUS | Status: DC

## 2023-02-14 NOTE — Progress Notes (Signed)
SICKLE CELL SERVICE PROGRESS NOTE  Cynthya Sayarath ZOX:[id number] DOB: 07-20-2000 DOA: 02/12/2023 PCP: Pcp, No  Assessment/Plan: Principal Problem:   Sickle cell pain crisis (HCC) Active Problems:   Acute on chronic anemia   Leukocytosis  Sickle cell pain crisis: Patient is opiate nave.  On Dilaudid PCA low-dose, Toradol and IV fluids.  Also on Percocet.  Patient seems to be doing better.  Will continue. Anemia of chronic disease: Hemoglobin dropped down to 6.5 but patient is refusing transfusion until she talks with her hematologist.  Will continue to monitor and check H/H again tomorrow. Leukocytosis: Secondary to sickle cell crisis.  Continue to monitor.  Code Status: Full code Family Communication: No family at bedside Disposition Plan: Home  [facility]  Pager [phone]. If 7PM-7AM, please contact night-coverage.  02/14/2023, 12:22 PM  LOS: 2 days   Brief narrative: Sanaiah Straube  is a 22 y.o. female with a medical history significant for sickle cell disease and anemia of chronic disease presented to [facility] with complaints of pain to chest, low back, and lower extremities.  Pain started around 1145 last night.  Patient attributes pain crisis to going out drinking with friends.  Patient states that she took Tylenol without very much relief and transition to her home oxycodone.  Patient currently rates pain as 9/10 mostly to her bilateral lower extremities.  She denies any headache, blurry vision, dizziness.  No nausea, vomiting, or diarrhea.  No sick contacts or recent.   Consultants: None  Procedures: None  Antibiotics: None  HPI/Subjective: Patient still has pain at 5 out of 10.  Hemoglobin has dropped otherwise she is doing better.  Objective: Vitals:   02/13/23 2341 02/14/23 0516 02/14/23 0808 02/14/23 0924  BP: (!) 101/59 109/63  (!) 96/47  Pulse: (!) 110 (!) 110  90  Resp: 16 20 17 16   Temp: 99.1 F (37.3 C) 99.3 F (37.4 C)  98.6 F (37 C)  TempSrc:  Oral Oral  Oral  SpO2: 99% 100% 100% 100%  Weight:  64.2 kg    Height:       Weight change: 7.501 kg  Intake/Output Summary (Last 24 hours) at 02/14/2023 1222 Last data filed at 02/14/2023 0522 Gross per 24 hour  Intake 1910.56 ml  Output 1900 ml  Net 10.56 ml    General: Alert, awake, oriented x3, in no acute distress.  HEENT: [place]/AT PEERL, EOMI Neck: Trachea midline,  no masses, no thyromegal,y no JVD, no carotid bruit OROPHARYNX:  Moist, No exudate/ erythema/lesions.  Heart: Regular rate and rhythm, without murmurs, rubs, gallops, PMI non-displaced, no heaves or thrills on palpation.  Lungs: Clear to auscultation, no wheezing or rhonchi noted. No increased vocal fremitus resonant to percussion  Abdomen: Soft, nontender, nondistended, positive bowel sounds, no masses no hepatosplenomegaly noted..  Neuro: No focal neurological deficits noted cranial nerves II through XII grossly intact. DTRs 2+ bilaterally upper and lower extremities. Strength 5 out of 5 in bilateral upper and lower extremities. Musculoskeletal: No warm swelling or erythema around joints, no spinal tenderness noted. Psychiatric: Patient alert and oriented x3, good insight and cognition, good recent to remote recall. Lymph node survey: No cervical axillary or inguinal lymphadenopathy noted.   Data Reviewed: Basic Metabolic Panel: Recent Labs  Lab 02/12/23 0755 02/13/23 0546 02/14/23 0617  NA 136 136 133*  K 3.8 3.8 3.7  CL 104 102 100  CO2 21* 23 24  GLUCOSE 115* 111* 112*  BUN 7 6 9   CREATININE 0.56 0.56  0.52  CALCIUM 9.3 8.9 8.4*   Liver Function Tests: No results for input(s): "AST", "ALT", "ALKPHOS", "BILITOT", "PROT", "ALBUMIN" in the last 168 hours. No results for input(s): "LIPASE", "AMYLASE" in the last 168 hours. No results for input(s): "AMMONIA" in the last 168 hours. CBC: Recent Labs  Lab 02/12/23 0755 02/13/23 0546 02/14/23 0617  WBC 15.3* 10.2 9.6  NEUTROABS 9.6*  --   --   HGB 7.4*  7.4* 6.5*  HCT 20.6* 20.6* 18.3*  MCV 98.1 100.5* 101.7*  PLT 332 282 256   Cardiac Enzymes: No results for input(s): "CKTOTAL", "CKMB", "CKMBINDEX", "TROPONINI" in the last 168 hours. BNP (last 3 results) No results for input(s): "BNP" in the last 8760 hours.  ProBNP (last 3 results) No results for input(s): "PROBNP" in the last 8760 hours.  CBG: No results for input(s): "GLUCAP" in the last 168 hours.  Recent Results (from the past 240 hour(s))  SARS Coronavirus 2 by RT PCR (hospital order, performed in [facility] hospital lab) *cepheid single result test* Anterior Nasal Swab     Status: None   Collection Time: 02/12/23  7:59 AM   Specimen: Anterior Nasal Swab  Result Value Ref Range Status   SARS Coronavirus 2 by RT PCR NEGATIVE NEGATIVE Final    Comment: (NOTE) SARS-CoV-2 target nucleic acids are NOT DETECTED.  The SARS-CoV-2 RNA is generally detectable in upper and lower respiratory specimens during the acute phase of infection. The lowest concentration of SARS-CoV-2 viral copies this assay can detect is 250 copies / mL. A negative result does not preclude SARS-CoV-2 infection and should not be used as the sole basis for treatment or other patient management decisions.  A negative result may occur with improper specimen collection / handling, submission of specimen other than nasopharyngeal swab, presence of viral mutation(s) within the areas targeted by this assay, and inadequate number of viral copies (<250 copies / mL). A negative result must be combined with clinical observations, patient history, and epidemiological information.  Fact Sheet for Patients:   RoadLapTop.co.za  Fact Sheet for Healthcare Providers: http://kim-miller.com/  This test is not yet approved or  cleared by the [place] FDA and has been authorized for detection and/or diagnosis of SARS-CoV-2 by FDA under an Emergency Use Authorization (EUA).   This EUA will remain in effect (meaning this test can be used) for the duration of the COVID-19 declaration under Section 564(b)(1) of the Act, 21 U.S.C. section 360bbb-3(b)(1), unless the authorization is terminated or revoked sooner.  Performed at [facility], [address] [place], [place] [address]      Studies: VAS [place] LOWER EXTREMITY VENOUS (DVT)  Result Date: 02/14/2023  Lower Venous DVT Study Patient Name:  MAHATI HARVAN  Date of Exam:   02/13/2023 Medical Rec #: [id number]   Accession #:    [id number] Date of Birth: 03/30/2001  Patient Gender: F Patient Age:   21 years Exam Location:  [facility] Procedure:      VAS [place] LOWER EXTREMITY VENOUS (DVT) Referring Phys: ABIGAIL CHAVEZ --------------------------------------------------------------------------------  Indications: Pain.  Risk Factors: None identified. Limitations: Patient pain tolerance. Comparison Study: No prior studies. Performing Technologist: Chanda Busing RVT  Examination Guidelines: A complete evaluation includes B-mode imaging, spectral Doppler, color Doppler, and power Doppler as needed of all accessible portions of each vessel. Bilateral testing is considered an integral part of a complete examination. Limited examinations for reoccurring indications may be performed as noted. The reflux portion of the  exam is performed with the patient in reverse Trendelenburg.  +---------+---------------+---------+-----------+----------+--------------+ RIGHT    CompressibilityPhasicitySpontaneityPropertiesThrombus Aging +---------+---------------+---------+-----------+----------+--------------+ CFV      Full           Yes      Yes                                 +---------+---------------+---------+-----------+----------+--------------+ SFJ      Full                                                        +---------+---------------+---------+-----------+----------+--------------+ FV Prox  Full                                                         +---------+---------------+---------+-----------+----------+--------------+ FV Mid   Full                                                        +---------+---------------+---------+-----------+----------+--------------+ FV DistalFull                                                        +---------+---------------+---------+-----------+----------+--------------+ PFV      Full                                                        +---------+---------------+---------+-----------+----------+--------------+ POP      Full           Yes      Yes                                 +---------+---------------+---------+-----------+----------+--------------+ PTV      Full                                                        +---------+---------------+---------+-----------+----------+--------------+ PERO     Full                                                        +---------+---------------+---------+-----------+----------+--------------+   +---------+---------------+---------+-----------+----------+--------------+ LEFT     CompressibilityPhasicitySpontaneityPropertiesThrombus Aging +---------+---------------+---------+-----------+----------+--------------+ CFV      Full           Yes      Yes                                 +---------+---------------+---------+-----------+----------+--------------+  SFJ      Full                                                        +---------+---------------+---------+-----------+----------+--------------+ FV Prox  Full                                                        +---------+---------------+---------+-----------+----------+--------------+ FV Mid   Full                                                        +---------+---------------+---------+-----------+----------+--------------+ FV DistalFull                                                         +---------+---------------+---------+-----------+----------+--------------+ PFV      Full                                                        +---------+---------------+---------+-----------+----------+--------------+ POP      Full           Yes      Yes                                 +---------+---------------+---------+-----------+----------+--------------+ PTV      Full                                                        +---------+---------------+---------+-----------+----------+--------------+ PERO     Full                                                        +---------+---------------+---------+-----------+----------+--------------+     Summary: RIGHT: - There is no evidence of deep vein thrombosis in the lower extremity.  - No cystic structure found in the popliteal fossa.  LEFT: - There is no evidence of deep vein thrombosis in the lower extremity.  - No cystic structure found in the popliteal fossa.  *See table(s) above for measurements and observations. Electronically signed by Heath Lark on 02/14/2023 at 11:14:52 AM.    Final    DG Chest Port 1 View  Result Date: 02/12/2023 CLINICAL DATA:  sickle cell.  Acute chest pain EXAM: PORTABLE CHEST 1 VIEW COMPARISON:  06/15/2022. FINDINGS: Bilateral lung fields are clear. Bilateral  costophrenic angles are clear. Stable cardio-mediastinal silhouette. No acute osseous abnormalities. The soft tissues are within normal limits. IMPRESSION: 1. No active disease. Electronically Signed   By: Jules Schick M.D.   On: 02/12/2023 08:55    Scheduled Meds:  sodium chloride   Intravenous Once   sodium chloride   Intravenous Once   cholecalciferol  1,000 Units Oral Daily   enoxaparin (LOVENOX) injection  40 mg Subcutaneous Q24H   famotidine  20 mg Oral Daily   gabapentin  600 mg Oral TID   HYDROmorphone   Intravenous Q4H   hydroxyurea  1,500 mg Oral QHS   ketorolac  15 mg Intravenous Q6H   lisinopril  5 mg Oral Daily    penicillin v potassium  250 mg Oral BID   senna-docusate  1 tablet Oral BID   Continuous Infusions:  sodium chloride 75 mL/hr at 02/14/23 0908    Principal Problem:   Sickle cell pain crisis (HCC) Active Problems:   Acute on chronic anemia   Leukocytosis

## 2023-02-15 DIAGNOSIS — D5700 Hb-SS disease with crisis, unspecified: Secondary | ICD-10-CM | POA: Diagnosis not present

## 2023-02-15 LAB — CBC WITH DIFFERENTIAL/PLATELET
Abs Immature Granulocytes: 0.08 10*3/uL — ABNORMAL HIGH (ref 0.00–0.07)
Basophils Absolute: 0 10*3/uL (ref 0.0–0.1)
Basophils Relative: 0 %
Eosinophils Absolute: 0.2 10*3/uL (ref 0.0–0.5)
Eosinophils Relative: 3 %
HCT: 18.4 % — ABNORMAL LOW (ref 36.0–46.0)
Hemoglobin: 6.3 g/dL — CL (ref 12.0–15.0)
Immature Granulocytes: 1 %
Lymphocytes Relative: 40 %
Lymphs Abs: 2.9 10*3/uL (ref 0.7–4.0)
MCH: 35.4 pg — ABNORMAL HIGH (ref 26.0–34.0)
MCHC: 34.2 g/dL (ref 30.0–36.0)
MCV: 103.4 fL — ABNORMAL HIGH (ref 80.0–100.0)
Monocytes Absolute: 0.4 10*3/uL (ref 0.1–1.0)
Monocytes Relative: 6 %
Neutro Abs: 3.6 10*3/uL (ref 1.7–7.7)
Neutrophils Relative %: 50 %
Platelets: 252 10*3/uL (ref 150–400)
RBC: 1.78 MIL/uL — ABNORMAL LOW (ref 3.87–5.11)
RDW: 20.2 % — ABNORMAL HIGH (ref 11.5–15.5)
WBC: 7.3 10*3/uL (ref 4.0–10.5)
nRBC: 30.5 % — ABNORMAL HIGH (ref 0.0–0.2)

## 2023-02-15 MED ORDER — OXYCODONE HCL 5 MG PO TABS
5.0000 mg | ORAL_TABLET | ORAL | Status: DC | PRN
  Administered 2023-02-15 – 2023-02-17 (×5): 5 mg via ORAL
  Filled 2023-02-15 (×5): qty 1

## 2023-02-15 MED ORDER — OXYCODONE HCL 5 MG PO TABS
5.0000 mg | ORAL_TABLET | ORAL | Status: DC | PRN
Start: 2023-02-15 — End: 2023-02-15

## 2023-02-15 NOTE — Progress Notes (Signed)
Subjective: Sarah Patterson is a 22 year old female with a medical history significant for sickle cell disease that was admitted for sickle cell pain crisis. Today, patient's hemoglobin is 6.3 g/dL.  Patient has been refusing blood transfusion until she speaks with her hematologist. Patient states the pain intensity has improved overnight.  She rates her lower extremity pain as 4/10.  She denies headache, dizziness, chest pain, urinary symptoms, nausea, vomiting, or diarrhea.  Objective:  Vital signs in last 24 hours:  Vitals:   02/15/23 1100 02/15/23 1203 02/15/23 1300 02/15/23 1558  BP: (!) 101/55  (!) 97/54   Pulse: 97  80   Resp: (!) 21 18 20    Temp: 98.5 F (36.9 C)  98.5 F (36.9 C)   TempSrc: Oral  Oral   SpO2: 100% 97% 97% 95%  Weight:      Height:        Intake/Output from previous day:   Intake/Output Summary (Last 24 hours) at 02/15/2023 1708 Last data filed at 02/15/2023 0641 Gross per 24 hour  Intake 1447.28 ml  Output 900 ml  Net 547.28 ml    Physical Exam: General: Alert, awake, oriented x3, in no acute distress.  HEENT: /AT PEERL, EOMI Neck: Trachea midline,  no masses, no thyromegal,y no JVD, no carotid bruit OROPHARYNX:  Moist, No exudate/ erythema/lesions.  Heart: Regular rate and rhythm, without murmurs, rubs, gallops, PMI non-displaced, no heaves or thrills on palpation.  Lungs: Clear to auscultation, no wheezing or rhonchi noted. No increased vocal fremitus resonant to percussion  Abdomen: Soft, nontender, nondistended, positive bowel sounds, no masses no hepatosplenomegaly noted..  Neuro: No focal neurological deficits noted cranial nerves II through XII grossly intact. DTRs 2+ bilaterally upper and lower extremities. Strength 5 out of 5 in bilateral upper and lower extremities. Musculoskeletal: No warm swelling or erythema around joints, no spinal tenderness noted. Psychiatric: Patient alert and oriented x3, good insight and cognition, good recent to  remote recall. Lymph node survey: No cervical axillary or inguinal lymphadenopathy noted.  Lab Results:  Basic Metabolic Panel:    Component Value Date/Time   NA 133 (L) 02/14/2023 0617   K 3.7 02/14/2023 0617   CL 100 02/14/2023 0617   CO2 24 02/14/2023 0617   BUN 9 02/14/2023 0617   CREATININE 0.52 02/14/2023 0617   GLUCOSE 112 (H) 02/14/2023 0617   CALCIUM 8.4 (L) 02/14/2023 0617   CBC:    Component Value Date/Time   WBC 7.3 02/15/2023 0535   HGB 6.3 (LL) 02/15/2023 0535   HCT 18.4 (L) 02/15/2023 0535   PLT 252 02/15/2023 0535   MCV 103.4 (H) 02/15/2023 0535   NEUTROABS 3.6 02/15/2023 0535   LYMPHSABS 2.9 02/15/2023 0535   MONOABS 0.4 02/15/2023 0535   EOSABS 0.2 02/15/2023 0535   BASOSABS 0.0 02/15/2023 0535    Recent Results (from the past 240 hour(s))  SARS Coronavirus 2 by RT PCR (hospital order, performed in [facility] hospital lab) *cepheid single result test* Anterior Nasal Swab     Status: None   Collection Time: 02/12/23  7:59 AM   Specimen: Anterior Nasal Swab  Result Value Ref Range Status   SARS Coronavirus 2 by RT PCR NEGATIVE NEGATIVE Final    Comment: (NOTE) SARS-CoV-2 target nucleic acids are NOT DETECTED.  The SARS-CoV-2 RNA is generally detectable in upper and lower respiratory specimens during the acute phase of infection. The lowest concentration of SARS-CoV-2 viral copies this assay can detect is 250 copies / mL. A negative  result does not preclude SARS-CoV-2 infection and should not be used as the sole basis for treatment or other patient management decisions.  A negative result may occur with improper specimen collection / handling, submission of specimen other than nasopharyngeal swab, presence of viral mutation(s) within the areas targeted by this assay, and inadequate number of viral copies (<250 copies / mL). A negative result must be combined with clinical observations, patient history, and epidemiological information.  Fact Sheet  for Patients:   RoadLapTop.co.za  Fact Sheet for Healthcare Providers: http://kim-miller.com/  This test is not yet approved or  cleared by the [place] FDA and has been authorized for detection and/or diagnosis of SARS-CoV-2 by FDA under an Emergency Use Authorization (EUA).  This EUA will remain in effect (meaning this test can be used) for the duration of the COVID-19 declaration under Section 564(b)(1) of the Act, 21 U.S.C. section 360bbb-3(b)(1), unless the authorization is terminated or revoked sooner.  Performed at [facility], [address] [place], [place] [address]     Studies/Results: No results found.  Medications: Scheduled Meds:  sodium chloride   Intravenous Once   sodium chloride   Intravenous Once   cholecalciferol  1,000 Units Oral Daily   enoxaparin (LOVENOX) injection  40 mg Subcutaneous Q24H   famotidine  20 mg Oral Daily   gabapentin  600 mg Oral TID   HYDROmorphone   Intravenous Q4H   hydroxyurea  1,500 mg Oral QHS   ketorolac  15 mg Intravenous Q6H   lisinopril  5 mg Oral Daily   penicillin v potassium  250 mg Oral BID   senna-docusate  1 tablet Oral BID   Continuous Infusions:  sodium chloride 10 mL/hr at 02/15/23 1406   PRN Meds:.acetaminophen, diphenhydrAMINE **OR** diphenhydrAMINE, naloxone **AND** sodium chloride flush, ondansetron, oxyCODONE, polyethylene glycol  Consultants: none  Procedures: none  Antibiotics: none  Assessment/Plan: Principal Problem:   Sickle cell pain crisis (HCC) Active Problems:   Acute on chronic anemia   Leukocytosis  Sickle cell disease with pain crisis: Pain intensity has improved.  Will discontinue IV Dilaudid PCA.  Oxycodone 5 mg every 4 hours as needed Toradol 15 mg every 6 hours for total of 5 days Decrease IV fluids to [facility] Monitor vital signs very closely, reevaluate pain scale regularly, and supplemental oxygen as  needed. Recommend ambulating in halls in preparation for discharge on 02/16/2023.  Anemia of chronic disease: Today, patient's hemoglobin is below her baseline at 6.3 g/dL.  May be secondary to hemodilution, decrease IV fluids to [facility]  Repeat CBC with differential and LDH in a.m. Discussed with patient's hematologist, Dr. Jenita Seashore who recommends refraining from blood transfusion at this time.  Leukocytosis: Secondary to sickle cell crisis.  Monitor closely.  No signs of acute infection.  Labs in AM.   Code Status: Full Code Family Communication: N/A Disposition Plan: Not yet ready for discharge Nolon Nations  APRN, MSN, FNP-C [facility] [address] [place], [place] [address] [phone]  If 7PM-7AM, please contact night-coverage.  02/15/2023, 5:08 PM  LOS: 3 days

## 2023-02-15 NOTE — Plan of Care (Signed)
Hemoglobin 6.3 this morning, patient educated on importance of blood transfusion. She states the last time she was transfused, hemoglobin was 5.0. She declines blood at this time until she speaks to her hematologist. MD Mikeal Hawthorne aware.  Problem: Education: Goal: Knowledge of General Education information will improve Description: Including pain rating scale, medication(s)/side effects and non-pharmacologic comfort measures Outcome: Progressing   Problem: Health Behavior/Discharge Planning: Goal: Ability to manage health-related needs will improve Outcome: Progressing   Problem: Clinical Measurements: Goal: Ability to maintain clinical measurements within normal limits will improve Outcome: Progressing Goal: Will remain free from infection Outcome: Progressing Goal: Diagnostic test results will improve Outcome: Progressing Goal: Respiratory complications will improve Outcome: Progressing Goal: Cardiovascular complication will be avoided Outcome: Progressing   Problem: Activity: Goal: Risk for activity intolerance will decrease Outcome: Progressing   Problem: Nutrition: Goal: Adequate nutrition will be maintained Outcome: Progressing   Problem: Coping: Goal: Level of anxiety will decrease Outcome: Progressing   Problem: Elimination: Goal: Will not experience complications related to bowel motility Outcome: Progressing Goal: Will not experience complications related to urinary retention Outcome: Progressing   Problem: Pain Managment: Goal: General experience of comfort will improve Outcome: Progressing   Problem: Safety: Goal: Ability to remain free from injury will improve Outcome: Progressing   Problem: Skin Integrity: Goal: Risk for impaired skin integrity will decrease Outcome: Progressing   Problem: Education: Goal: Knowledge of vaso-occlusive preventative measures will improve Outcome: Progressing Goal: Awareness of infection prevention will improve Outcome:  Progressing Goal: Awareness of signs and symptoms of anemia will improve Outcome: Progressing Goal: Long-term complications will improve Outcome: Progressing   Problem: Self-Care: Goal: Ability to incorporate actions that prevent/reduce pain crisis will improve Outcome: Progressing   Problem: Bowel/Gastric: Goal: Gut motility will be maintained Outcome: Progressing   Problem: Tissue Perfusion: Goal: Complications related to inadequate tissue perfusion will be avoided or minimized Outcome: Progressing   Problem: Respiratory: Goal: Pulmonary complications will be avoided or minimized Outcome: Progressing Goal: Acute Chest Syndrome will be identified early to prevent complications Outcome: Progressing   Problem: Fluid Volume: Goal: Ability to maintain a balanced intake and output will improve Outcome: Progressing   Problem: Sensory: Goal: Pain level will decrease with appropriate interventions Outcome: Progressing   Problem: Health Behavior: Goal: Postive changes in compliance with treatment and prescription regimens will improve Outcome: Progressing

## 2023-02-15 NOTE — Plan of Care (Signed)

## 2023-02-16 DIAGNOSIS — D5700 Hb-SS disease with crisis, unspecified: Secondary | ICD-10-CM | POA: Diagnosis not present

## 2023-02-16 LAB — CBC
HCT: 15.1 % — ABNORMAL LOW (ref 36.0–46.0)
Hemoglobin: 5.3 g/dL — CL (ref 12.0–15.0)
MCH: 36.1 pg — ABNORMAL HIGH (ref 26.0–34.0)
MCHC: 35.1 g/dL (ref 30.0–36.0)
MCV: 102.7 fL — ABNORMAL HIGH (ref 80.0–100.0)
Platelets: 256 10*3/uL (ref 150–400)
RBC: 1.47 MIL/uL — ABNORMAL LOW (ref 3.87–5.11)
RDW: 20.6 % — ABNORMAL HIGH (ref 11.5–15.5)
WBC: 5 10*3/uL (ref 4.0–10.5)
nRBC: 62 % — ABNORMAL HIGH (ref 0.0–0.2)

## 2023-02-16 LAB — COMPREHENSIVE METABOLIC PANEL
ALT: 19 U/L (ref 0–44)
AST: 34 U/L (ref 15–41)
Albumin: 3.5 g/dL (ref 3.5–5.0)
Alkaline Phosphatase: 68 U/L (ref 38–126)
Anion gap: 10 (ref 5–15)
BUN: 11 mg/dL (ref 6–20)
CO2: 22 mmol/L (ref 22–32)
Calcium: 8.6 mg/dL — ABNORMAL LOW (ref 8.9–10.3)
Chloride: 104 mmol/L (ref 98–111)
Creatinine, Ser: 0.52 mg/dL (ref 0.44–1.00)
GFR, Estimated: >60 mL/min (ref >60)
Glucose, Bld: 85 mg/dL (ref 70–99)
Potassium: 4.1 mmol/L (ref 3.5–5.1)
Sodium: 136 mmol/L (ref 135–145)
Total Bilirubin: 4.2 mg/dL — ABNORMAL HIGH (ref 0.3–1.2)
Total Protein: 7.5 g/dL (ref 6.5–8.1)

## 2023-02-16 LAB — LACTATE DEHYDROGENASE: LDH: 546 U/L — ABNORMAL HIGH (ref 98–192)

## 2023-02-16 LAB — PREPARE RBC (CROSSMATCH)

## 2023-02-16 MED ORDER — SODIUM CHLORIDE 0.9% IV SOLUTION: Freq: Once | INTRAVENOUS | Status: AC

## 2023-02-16 MED ORDER — DIPHENHYDRAMINE HCL 50 MG/ML IJ SOLN
25.0000 mg | Freq: Once | INTRAMUSCULAR | Status: AC
  Administered 2023-02-16: 25 mg via INTRAVENOUS
  Filled 2023-02-16: qty 1

## 2023-02-16 MED ORDER — ACETAMINOPHEN 325 MG PO TABS
650.0000 mg | ORAL_TABLET | Freq: Once | ORAL | Status: AC
  Administered 2023-02-16: 650 mg via ORAL
  Filled 2023-02-16: qty 2

## 2023-02-16 NOTE — Progress Notes (Signed)
   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

## 2023-02-16 NOTE — Plan of Care (Signed)

## 2023-02-16 NOTE — Progress Notes (Signed)
PHARMACIST - PHYSICIAN COMMUNICATION  CONCERNING: Hydroxyurea   Patient's hemoglobin today is 5.3, which is below the acceptable threshold for hydroxyurea therapy. Therefore, the order has been temporarily discontinued due to this unmet lab parameter. Please reorder when hemoglobin increases to >6.   Hold criteria for hydroxyurea:  ANC < 2 Pltc < 80K in sickle-cell patients; < 100K in other patients Hgb < 6 in sickle-cell patients; < 8 in other patients Reticulocytes < 80K when Hgb < 9   Pharmacy will continue to follow.   Thank you,    Tacy Learn, PharmD Clinical Pharmacist 02/16/2023 11:57 AM

## 2023-02-16 NOTE — Progress Notes (Signed)
Subjective: Sarah Patterson is a 22 year old female with a medical history significant for sickle cell disease that was admitted for sickle cell pain crisis. Today, patient's hemoglobin is decreased to 5.3 g/dL, below patient's baseline of 7-8 g/dL.  She has been refusing blood transfusion.  Consulted with patient's hematologist on yesterday, who recommended holding off on blood transfusion.  However, patient's hemoglobin continues to decrease.  LDH is markedly elevated.   Patient states the pain intensity has improved overnight.  She rates her lower extremity pain as 4/10.  She denies headache, dizziness, chest pain, urinary symptoms, nausea, vomiting, or diarrhea.  Objective:  Vital signs in last 24 hours:  Vitals:   02/16/23 1134 02/16/23 1330 02/16/23 1445 02/16/23 1517  BP: 116/72 115/69 119/69 108/64  Pulse: 80 67 72 78  Resp: 16 20 20 20   Temp: 99.6 F (37.6 C) 98.8 F (37.1 C) 99.7 F (37.6 C) 98.8 F (37.1 C)  TempSrc: Oral Oral Oral Oral  SpO2: 95% 96% 96% 99%  Weight:      Height:        Intake/Output from previous day:   Intake/Output Summary (Last 24 hours) at 02/16/2023 1710 Last data filed at 02/16/2023 1500 Gross per 24 hour  Intake 1096.14 ml  Output 1000 ml  Net 96.14 ml    Physical Exam: General: Alert, awake, oriented x3, in no acute distress.  HEENT: [place]/AT PEERL, EOMI Neck: Trachea midline,  no masses, no thyromegal,y no JVD, no carotid bruit OROPHARYNX:  Moist, No exudate/ erythema/lesions.  Heart: Regular rate and rhythm, without murmurs, rubs, gallops, PMI non-displaced, no heaves or thrills on palpation.  Lungs: Clear to auscultation, no wheezing or rhonchi noted. No increased vocal fremitus resonant to percussion  Abdomen: Soft, nontender, nondistended, positive bowel sounds, no masses no hepatosplenomegaly noted..  Neuro: No focal neurological deficits noted cranial nerves II through XII grossly intact. DTRs 2+ bilaterally upper and lower extremities.  Strength 5 out of 5 in bilateral upper and lower extremities. Musculoskeletal: No warm swelling or erythema around joints, no spinal tenderness noted. Psychiatric: Patient alert and oriented x3, good insight and cognition, good recent to remote recall. Lymph node survey: No cervical axillary or inguinal lymphadenopathy noted.  Lab Results:  Basic Metabolic Panel:    Component Value Date/Time   NA 136 02/16/2023 0918   K 4.1 02/16/2023 0918   CL 104 02/16/2023 0918   CO2 22 02/16/2023 0918   BUN 11 02/16/2023 0918   CREATININE 0.52 02/16/2023 0918   GLUCOSE 85 02/16/2023 0918   CALCIUM 8.6 (L) 02/16/2023 0918   CBC:    Component Value Date/Time   WBC 5.0 02/16/2023 0755   HGB 5.3 (LL) 02/16/2023 0755   HCT 15.1 (L) 02/16/2023 0755   PLT 256 02/16/2023 0755   MCV 102.7 (H) 02/16/2023 0755   NEUTROABS 3.6 02/15/2023 0535   LYMPHSABS 2.9 02/15/2023 0535   MONOABS 0.4 02/15/2023 0535   EOSABS 0.2 02/15/2023 0535   BASOSABS 0.0 02/15/2023 0535    Recent Results (from the past 240 hour(s))  SARS Coronavirus 2 by RT PCR (hospital order, performed in [facility] hospital lab) *cepheid single result test* Anterior Nasal Swab     Status: None   Collection Time: 02/12/23  7:59 AM   Specimen: Anterior Nasal Swab  Result Value Ref Range Status   SARS Coronavirus 2 by RT PCR NEGATIVE NEGATIVE Final    Comment: (NOTE) SARS-CoV-2 target nucleic acids are NOT DETECTED.  The SARS-CoV-2 RNA is generally  detectable in upper and lower respiratory specimens during the acute phase of infection. The lowest concentration of SARS-CoV-2 viral copies this assay can detect is 250 copies / mL. A negative result does not preclude SARS-CoV-2 infection and should not be used as the sole basis for treatment or other patient management decisions.  A negative result may occur with improper specimen collection / handling, submission of specimen other than nasopharyngeal swab, presence of viral  mutation(s) within the areas targeted by this assay, and inadequate number of viral copies (<250 copies / mL). A negative result must be combined with clinical observations, patient history, and epidemiological information.  Fact Sheet for Patients:   RoadLapTop.co.za  Fact Sheet for Healthcare Providers: http://kim-miller.com/  This test is not yet approved or  cleared by the [place] FDA and has been authorized for detection and/or diagnosis of SARS-CoV-2 by FDA under an Emergency Use Authorization (EUA).  This EUA will remain in effect (meaning this test can be used) for the duration of the COVID-19 declaration under Section 564(b)(1) of the Act, 21 U.S.C. section 360bbb-3(b)(1), unless the authorization is terminated or revoked sooner.  Performed at [facility], [address] [place], [place] [address]     Studies/Results: No results found.  Medications: Scheduled Meds:  cholecalciferol  1,000 Units Oral Daily   famotidine  20 mg Oral Daily   gabapentin  600 mg Oral TID   ketorolac  15 mg Intravenous Q6H   lisinopril  5 mg Oral Daily   penicillin v potassium  250 mg Oral BID   senna-docusate  1 tablet Oral BID   Continuous Infusions:  sodium chloride 10 mL/hr at 02/16/23 0630   PRN Meds:.acetaminophen, ondansetron, oxyCODONE, polyethylene glycol  Consultants: none  Procedures: none  Antibiotics: none  Assessment/Plan: Principal Problem:   Sickle cell pain crisis (HCC) Active Problems:   Acute on chronic anemia   Leukocytosis  Sickle cell disease with pain crisis: Continue oxycodone 5 mg every 4 hours as needed Toradol 15 mg every 6 hours for total of 5 days Monitor vital signs very closely, reevaluate pain scale regularly, and supplemental oxygen as needed.   Anemia of chronic disease: Patient's hemoglobin is 5.3 g/dL.  Will transfuse 2 units PRBCs today.  Discussed with patient at  length, she expressed understanding.  Will continue to consult with patient's hematologist Dr. Jenita Seashore as needed. Will follow labs in AM.  Leukocytosis: Secondary to sickle cell crisis.  Monitor closely.  No signs of acute infection.  Labs in AM.   Code Status: Full Code Family Communication: N/A Disposition Plan: Not yet ready for discharge.  Discharge planned for 02/17/2023 Nolon Nations  APRN, MSN, FNP-C Patient Care [facility] [address] [place], [place] [address] [phone]  If 7PM-7AM, please contact night-coverage.  02/16/2023, 5:10 PM  LOS: 4 days

## 2023-02-17 ENCOUNTER — Encounter: Payer: Self-pay | Admitting: Family Medicine

## 2023-02-17 DIAGNOSIS — D5700 Hb-SS disease with crisis, unspecified: Secondary | ICD-10-CM | POA: Diagnosis not present

## 2023-02-17 LAB — TYPE AND SCREEN
ABO/RH(D): B POS
Antibody Screen: POSITIVE
DAT, IgG: POSITIVE
PT AG Type: NEGATIVE
Unit division: 0
Unit division: 0

## 2023-02-17 LAB — BPAM RBC
Blood Product Expiration Date: 202409292359
Blood Product Expiration Date: 202410072359
ISSUE DATE / TIME: 202409101107
ISSUE DATE / TIME: 202409101455
Unit Type and Rh: 5100
Unit Type and Rh: 7300

## 2023-02-17 LAB — CBC
HCT: 25.9 % — ABNORMAL LOW (ref 36.0–46.0)
Hemoglobin: 9.2 g/dL — ABNORMAL LOW (ref 12.0–15.0)
MCH: 32.4 pg (ref 26.0–34.0)
MCHC: 35.5 g/dL (ref 30.0–36.0)
MCV: 91.2 fL (ref 80.0–100.0)
Platelets: 295 10*3/uL (ref 150–400)
RBC: 2.84 MIL/uL — ABNORMAL LOW (ref 3.87–5.11)
RDW: 24.6 % — ABNORMAL HIGH (ref 11.5–15.5)
WBC: 6 10*3/uL (ref 4.0–10.5)
nRBC: 70.3 % — ABNORMAL HIGH (ref 0.0–0.2)

## 2023-02-17 LAB — LACTATE DEHYDROGENASE: LDH: 526 U/L — ABNORMAL HIGH (ref 98–192)

## 2023-02-17 MED ORDER — OXYCODONE HCL 5 MG PO TABS: 5.0000 mg | ORAL_TABLET | ORAL | 0 refills | Status: AC | PRN

## 2023-02-17 NOTE — Plan of Care (Signed)
  Problem: Education: Goal: Knowledge of General Education information will improve Description: Including pain rating scale, medication(s)/side effects and non-pharmacologic comfort measures Outcome: Completed/Met   Problem: Health Behavior/Discharge Planning: Goal: Ability to manage health-related needs will improve Outcome: Completed/Met   Problem: Clinical Measurements: Goal: Ability to maintain clinical measurements within normal limits will improve Outcome: Completed/Met Goal: Will remain free from infection Outcome: Completed/Met Goal: Diagnostic test results will improve Outcome: Completed/Met Goal: Respiratory complications will improve Outcome: Completed/Met Goal: Cardiovascular complication will be avoided Outcome: Completed/Met   Problem: Activity: Goal: Risk for activity intolerance will decrease Outcome: Completed/Met   Problem: Nutrition: Goal: Adequate nutrition will be maintained Outcome: Completed/Met   Problem: Coping: Goal: Level of anxiety will decrease Outcome: Completed/Met   Problem: Elimination: Goal: Will not experience complications related to bowel motility Outcome: Completed/Met Goal: Will not experience complications related to urinary retention Outcome: Completed/Met   Problem: Pain Managment: Goal: General experience of comfort will improve Outcome: Completed/Met   Problem: Safety: Goal: Ability to remain free from injury will improve Outcome: Completed/Met   Problem: Skin Integrity: Goal: Risk for impaired skin integrity will decrease Outcome: Completed/Met   Problem: Education: Goal: Knowledge of vaso-occlusive preventative measures will improve Outcome: Completed/Met Goal: Awareness of infection prevention will improve Outcome: Completed/Met Goal: Awareness of signs and symptoms of anemia will improve Outcome: Completed/Met Goal: Long-term complications will improve Outcome: Completed/Met   Problem: Self-Care: Goal:  Ability to incorporate actions that prevent/reduce pain crisis will improve Outcome: Completed/Met   Problem: Bowel/Gastric: Goal: Gut motility will be maintained Outcome: Completed/Met   Problem: Tissue Perfusion: Goal: Complications related to inadequate tissue perfusion will be avoided or minimized Outcome: Completed/Met   Problem: Respiratory: Goal: Pulmonary complications will be avoided or minimized Outcome: Completed/Met Goal: Acute Chest Syndrome will be identified early to prevent complications Outcome: Completed/Met   Problem: Fluid Volume: Goal: Ability to maintain a balanced intake and output will improve Outcome: Completed/Met   Problem: Sensory: Goal: Pain level will decrease with appropriate interventions Outcome: Completed/Met   Problem: Health Behavior: Goal: Postive changes in compliance with treatment and prescription regimens will improve Outcome: Completed/Met

## 2023-02-17 NOTE — Plan of Care (Signed)
  Problem: Education: Goal: Knowledge of General Education information will improve Description: Including pain rating scale, medication(s)/side effects and non-pharmacologic comfort measures Outcome: Progressing   Problem: Health Behavior/Discharge Planning: Goal: Ability to manage health-related needs will improve Outcome: Progressing   Problem: Clinical Measurements: Goal: Will remain free from infection Outcome: Progressing   

## 2023-02-20 NOTE — Discharge Summary (Signed)
Physician Discharge Summary  [facility] YQM:[id number] DOB: [birth year] DOA: 02/12/2023  PCP: Pcp, No  Admit date: 02/12/2023  Discharge date: 02/17/2023  Discharge Diagnoses:  Principal Problem:   Sickle cell pain crisis (HCC) Active Problems:   Acute on chronic anemia   Leukocytosis   Discharge Condition: Stable  Disposition:   Follow-up Information     Massie Maroon, FNP Follow up.   Specialty: Family Medicine Why: Labs at [facility] Patient [facility] [phone] Contact information: [address] [place] [address] [phone]                Pt is discharged home in good condition and is to follow up with Pcp, No this week to have labs evaluated. [name] is instructed to increase activity slowly and balance with rest for the next few days, and use prescribed medication to complete treatment of pain  Diet: Regular Wt Readings from Last 3 Encounters:  02/14/23 64.2 kg  06/22/22 55.3 kg  03/12/21 52.2 kg (27%, Z= -0.60)*   * Growth percentiles are based on CDC (Girls, 2-20 Years) data.    History of present illness:  Sarah Patterson  is a 22 y.o. female with a medical history significant for sickle cell disease and anemia of chronic disease presented to [facility] with complaints of pain to chest, low back, and lower extremities.  Pain started around 1145 last night.  Patient attributes pain crisis to going out drinking with friends.  Patient states that she took Tylenol without very much relief and transition to her home oxycodone.  Patient currently rates pain as 9/10 mostly to her bilateral lower extremities.  She denies any headache, blurry vision, dizziness.  No nausea, vomiting, or diarrhea.  No sick contacts or recent. ER Course:  Vital signs show: BP 115/75   Pulse (!) 102   Temp 100 F (37.8 C) (Oral)   Resp (!) 34   Wt 56.7 kg   SpO2 98%   BMI 23.62 kg/m Complete blood count notable for WBCs 15.3, hemoglobin 7.4 g/dL, and  platelets 425,956.  Basic metabolic panel unremarkable.  Chest x-ray shows no active cardiopulmonary disease.  Pain persists despite IV fluids, and IV Dilaudid.  Patient admitted to [facility] for further management of sickle cell pain crisis.  Hospital Course:  Sickle cell pain:  Patient was admitted for sickle cell pain crisis and managed appropriately with IVF, IV Dilaudid via PCA and IV Toradol, as well as other adjunct therapies per sickle cell pain management protocols.  IV Dilaudid PCA weaned appropriately and discontinued.  Patient was transition to oxycodone 5 mg every 6 hours as needed #30 was sent to patient's pharmacy.  PDMP reviewed prior to prescribing opiate medications and no inconsistencies were noted. Symptomatic anemia: Patient's hemoglobin dropped below baseline at 5.3.  Patient has a history of alloantibodies.  Discussed treatment plan with patient's hematologist, Dr. 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  Discussed patient's treatment course and labs at length. Prior to discharge, patient's hemoglobin improved to 9.2 g/dL and LDH is trending down.  Patient will follow-up at the sickle cell clinic in 1 week for CBC with differential and CMP. Patient is aware of upcoming appointment. She is alert, oriented, and ambulating without assistance.  Patient was therefore discharged home today in a hemodynamically stable condition.   [name] will follow-up with PCP within 1 week of this discharge. [name] was counseled extensively  about nonpharmacologic means of pain management, patient verbalized understanding and was appreciative of  the care received during this admission.   We discussed the need for good hydration, monitoring of hydration status, avoidance of heat, cold, stress, and infection triggers. We discussed the need to be adherent with taking Hydrea and other home medications. Patient was reminded of the need to  seek medical attention immediately if any symptom of bleeding, anemia, or infection occurs.  Discharge Exam: Vitals:   02/17/23 0510 02/17/23 1005  BP: 117/79 103/70  Pulse: 74 76  Resp: 18 20  Temp: 99 F (37.2 C) 98.3 F (36.8 C)  SpO2: 95% 97%   Vitals:   02/16/23 1715 02/16/23 2132 02/17/23 0510 02/17/23 1005  BP: 122/75 139/89 117/79 103/70  Pulse: 90 76 74 76  Resp: 20 18 18 20   Temp: 98.3 F (36.8 C) 98.8 F (37.1 C) 99 F (37.2 C) 98.3 F (36.8 C)  TempSrc: Oral Oral Oral Oral  SpO2: 96% 97% 95% 97%  Weight:      Height:        General appearance : Awake, alert, not in any distress. Speech Clear. Not toxic looking HEENT: Atraumatic and Normocephalic, pupils equally reactive to light and accomodation Neck: Supple, no JVD. No cervical lymphadenopathy.  Chest: Good air entry bilaterally, no added sounds  CVS: S1 S2 regular, no murmurs.  Abdomen: Bowel sounds present, Non tender and not distended with no gaurding, rigidity or rebound. Extremities: B/L Lower Ext shows no edema, both legs are warm to touch Neurology: Awake alert, and oriented X 3, CN II-XII intact, Non focal Skin: No Rash  Discharge Instructions  Discharge Instructions     Discharge patient   Complete by: As directed    Discharge disposition: 01-Home or Self Care   Discharge patient date: 02/17/2023      Allergies as of 02/17/2023   No Known Allergies      Medication List     TAKE these medications    acetaminophen 500 MG tablet Commonly known as: TYLENOL Take 500-1,000 mg by mouth every 6 (six) hours as needed for mild pain, moderate pain or headache.   Aleve 220 MG tablet Generic drug: naproxen sodium Take 220-440 mg by mouth as needed (for pain, headaches).   celecoxib 100 MG capsule Commonly known as: CELEBREX Take 100 mg by mouth daily.   famotidine 20 MG tablet Commonly known as: PEPCID Take 20 mg by mouth daily.   gabapentin 300 MG capsule Commonly known as:  NEURONTIN Take 600 mg by mouth 3 (three) times daily.   hydroxyurea 500 MG capsule Commonly known as: HYDREA Take 1,500 mg by mouth at bedtime.   lisinopril 5 MG tablet Commonly known as: ZESTRIL Take 5 mg by mouth daily.   magnesium oxide 400 MG tablet Commonly known as: MAG-OX Take 400 mg by mouth daily.   ondansetron 4 MG tablet Commonly known as: ZOFRAN Take 4 mg by mouth as needed for nausea or vomiting.   oxyCODONE 5 MG immediate release tablet Commonly known as: Oxy IR/ROXICODONE Take 1 tablet (5 mg total) by mouth as needed for severe pain (during a crisis).   penicillin v potassium 250 MG tablet Commonly known as: VEETID Take 250 mg by mouth 2 (two) times daily.   SF 5000 Plus 1.1 % Crea dental cream Generic drug: sodium fluoride Place 1 Application onto teeth at bedtime.   Vitamin D3 1000 units Caps Take 1,000 Units by mouth daily.  The results of significant diagnostics from this hospitalization (including imaging, microbiology, ancillary and laboratory) are listed below for reference.    Significant Diagnostic Studies: VAS [place] LOWER EXTREMITY VENOUS (DVT)  Result Date: 02/14/2023  Lower Venous DVT Study Patient Name:  ADLYNN LARIOS  Date of Exam:   02/13/2023 Medical Rec #: [id number]   Accession #:    [id number] Date of Birth: 02/28/2001  Patient Gender: F Patient Age:   21 years Exam Location:  [facility] Procedure:      VAS [place] LOWER EXTREMITY VENOUS (DVT) Referring Phys: ABIGAIL CHAVEZ --------------------------------------------------------------------------------  Indications: Pain.  Risk Factors: None identified. Limitations: Patient pain tolerance. Comparison Study: No prior studies. Performing Technologist: Chanda Busing RVT  Examination Guidelines: A complete evaluation includes B-mode imaging, spectral Doppler, color Doppler, and power Doppler as needed of all accessible portions of each vessel. Bilateral testing is considered an integral  part of a complete examination. Limited examinations for reoccurring indications may be performed as noted. The reflux portion of the exam is performed with the patient in reverse Trendelenburg.  +---------+---------------+---------+-----------+----------+--------------+ RIGHT    CompressibilityPhasicitySpontaneityPropertiesThrombus Aging +---------+---------------+---------+-----------+----------+--------------+ CFV      Full           Yes      Yes                                 +---------+---------------+---------+-----------+----------+--------------+ SFJ      Full                                                        +---------+---------------+---------+-----------+----------+--------------+ FV Prox  Full                                                        +---------+---------------+---------+-----------+----------+--------------+ FV Mid   Full                                                        +---------+---------------+---------+-----------+----------+--------------+ FV DistalFull                                                        +---------+---------------+---------+-----------+----------+--------------+ PFV      Full                                                        +---------+---------------+---------+-----------+----------+--------------+ POP      Full           Yes      Yes                                 +---------+---------------+---------+-----------+----------+--------------+  PTV      Full                                                        +---------+---------------+---------+-----------+----------+--------------+ PERO     Full                                                        +---------+---------------+---------+-----------+----------+--------------+   +---------+---------------+---------+-----------+----------+--------------+ LEFT     CompressibilityPhasicitySpontaneityPropertiesThrombus Aging  +---------+---------------+---------+-----------+----------+--------------+ CFV      Full           Yes      Yes                                 +---------+---------------+---------+-----------+----------+--------------+ SFJ      Full                                                        +---------+---------------+---------+-----------+----------+--------------+ FV Prox  Full                                                        +---------+---------------+---------+-----------+----------+--------------+ FV Mid   Full                                                        +---------+---------------+---------+-----------+----------+--------------+ FV DistalFull                                                        +---------+---------------+---------+-----------+----------+--------------+ PFV      Full                                                        +---------+---------------+---------+-----------+----------+--------------+ POP      Full           Yes      Yes                                 +---------+---------------+---------+-----------+----------+--------------+ PTV      Full                                                        +---------+---------------+---------+-----------+----------+--------------+  PERO     Full                                                        +---------+---------------+---------+-----------+----------+--------------+     Summary: RIGHT: - There is no evidence of deep vein thrombosis in the lower extremity.  - No cystic structure found in the popliteal fossa.  LEFT: - There is no evidence of deep vein thrombosis in the lower extremity.  - No cystic structure found in the popliteal fossa.  *See table(s) above for measurements and observations. Electronically signed by Heath Lark on 02/14/2023 at 11:14:52 AM.    Final    DG Chest Port 1 View  Result Date: 02/12/2023 CLINICAL DATA:  sickle cell.  Acute chest pain  EXAM: PORTABLE CHEST 1 VIEW COMPARISON:  06/15/2022. FINDINGS: Bilateral lung fields are clear. Bilateral costophrenic angles are clear. Stable cardio-mediastinal silhouette. No acute osseous abnormalities. The soft tissues are within normal limits. IMPRESSION: 1. No active disease. Electronically Signed   By: Jules Schick M.D.   On: 02/12/2023 08:55    Microbiology: Recent Results (from the past 240 hour(s))  SARS Coronavirus 2 by RT PCR (hospital order, performed in [facility] hospital lab) *cepheid single result test* Anterior Nasal Swab     Status: None   Collection Time: 02/12/23  7:59 AM   Specimen: Anterior Nasal Swab  Result Value Ref Range Status   SARS Coronavirus 2 by RT PCR NEGATIVE NEGATIVE Final    Comment: (NOTE) SARS-CoV-2 target nucleic acids are NOT DETECTED.  The SARS-CoV-2 RNA is generally detectable in upper and lower respiratory specimens during the acute phase of infection. The lowest concentration of SARS-CoV-2 viral copies this assay can detect is 250 copies / mL. A negative result does not preclude SARS-CoV-2 infection and should not be used as the sole basis for treatment or other patient management decisions.  A negative result may occur with improper specimen collection / handling, submission of specimen other than nasopharyngeal swab, presence of viral mutation(s) within the areas targeted by this assay, and inadequate number of viral copies (<250 copies / mL). A negative result must be combined with clinical observations, patient history, and epidemiological information.  Fact Sheet for Patients:   RoadLapTop.co.za  Fact Sheet for Healthcare Providers: http://kim-miller.com/  This test is not yet approved or  cleared by the [place] FDA and has been authorized for detection and/or diagnosis of SARS-CoV-2 by FDA under an Emergency Use Authorization (EUA).  This EUA will remain in effect (meaning this  test can be used) for the duration of the COVID-19 declaration under Section 564(b)(1) of the Act, 21 U.S.C. section 360bbb-3(b)(1), unless the authorization is terminated or revoked sooner.  Performed at [facility], [address] [place], [place] [address]      Labs: Basic Metabolic Panel: Recent Labs  Lab 02/14/23 0617 02/16/23 0918  NA 133* 136  K 3.7 4.1  CL 100 104  CO2 24 22  GLUCOSE 112* 85  BUN 9 11  CREATININE 0.52 0.52  CALCIUM 8.4* 8.6*   Liver Function Tests: Recent Labs  Lab 02/16/23 0918  AST 34  ALT 19  ALKPHOS 68  BILITOT 4.2*  PROT 7.5  ALBUMIN 3.5   No results for input(s): "LIPASE", "AMYLASE" in the last 168 hours.  No results for input(s): "AMMONIA" in the last 168 hours. CBC: Recent Labs  Lab 02/14/23 0617 02/15/23 0535 02/16/23 0755 02/17/23 0600  WBC 9.6 7.3 5.0 6.0  NEUTROABS  --  3.6  --   --   HGB 6.5* 6.3* 5.3* 9.2*  HCT 18.3* 18.4* 15.1* 25.9*  MCV 101.7* 103.4* 102.7* 91.2  PLT 256 252 256 295   Cardiac Enzymes: No results for input(s): "CKTOTAL", "CKMB", "CKMBINDEX", "TROPONINI" in the last 168 hours. BNP: Invalid input(s): "POCBNP" CBG: No results for input(s): "GLUCAP" in the last 168 hours.  Time coordinating discharge: 30 minutes  Signed:  Nolon Nations  APRN, MSN, FNP-C [facility] [address] [place], [place] [address] [phone]  [facility] 02/20/2023, 6:13 PM

## 2023-03-12 ENCOUNTER — Encounter (HOSPITAL_COMMUNITY): Payer: Self-pay | Admitting: Emergency Medicine

## 2023-03-12 ENCOUNTER — Other Ambulatory Visit: Payer: Self-pay

## 2023-03-12 ENCOUNTER — Observation Stay (HOSPITAL_COMMUNITY)
Admission: EM | Admit: 2023-03-12 | Discharge: 2023-03-13 | Disposition: A | Discharge status: Home / Self Care | Payer: BLUE CROSS/BLUE SHIELD | Attending: Internal Medicine | Admitting: Internal Medicine

## 2023-03-12 DIAGNOSIS — D571 Sickle-cell disease without crisis: Secondary | ICD-10-CM | POA: Diagnosis not present

## 2023-03-12 DIAGNOSIS — D649 Anemia, unspecified: Secondary | ICD-10-CM | POA: Diagnosis not present

## 2023-03-12 DIAGNOSIS — Z79899 Other long term (current) drug therapy: Secondary | ICD-10-CM | POA: Insufficient documentation

## 2023-03-12 DIAGNOSIS — R799 Abnormal finding of blood chemistry, unspecified: Secondary | ICD-10-CM | POA: Diagnosis present

## 2023-03-12 LAB — RETICULOCYTES
Immature Retic Fract: 48.4 % — ABNORMAL HIGH (ref 2.3–15.9)
RBC.: 1.14 MIL/uL — ABNORMAL LOW (ref 3.87–5.11)
Retic Count, Absolute: 185.1 10*3/uL (ref 19.0–186.0)
Retic Ct Pct: 16.2 % — ABNORMAL HIGH (ref 0.4–3.1)

## 2023-03-12 LAB — COMPREHENSIVE METABOLIC PANEL
ALT: 14 U/L (ref 0–44)
AST: 35 U/L (ref 15–41)
Albumin: 3.8 g/dL (ref 3.5–5.0)
Alkaline Phosphatase: 71 U/L (ref 38–126)
Anion gap: 6 (ref 5–15)
BUN: 6 mg/dL (ref 6–20)
CO2: 23 mmol/L (ref 22–32)
Calcium: 8.7 mg/dL — ABNORMAL LOW (ref 8.9–10.3)
Chloride: 105 mmol/L (ref 98–111)
Creatinine, Ser: 0.61 mg/dL (ref 0.44–1.00)
GFR, Estimated: >60 mL/min (ref >60)
Glucose, Bld: 85 mg/dL (ref 70–99)
Potassium: 3.8 mmol/L (ref 3.5–5.1)
Sodium: 134 mmol/L — ABNORMAL LOW (ref 135–145)
Total Bilirubin: 1.9 mg/dL — ABNORMAL HIGH (ref 0.3–1.2)
Total Protein: 8.6 g/dL — ABNORMAL HIGH (ref 6.5–8.1)

## 2023-03-12 LAB — PREPARE RBC (CROSSMATCH)

## 2023-03-12 LAB — LACTATE DEHYDROGENASE: LDH: 488 U/L — ABNORMAL HIGH (ref 98–192)

## 2023-03-12 MED ORDER — INFLUENZA VIRUS VACC SPLIT PF (FLUZONE) 0.5 ML IM SUSY
0.5000 mL | PREFILLED_SYRINGE | INTRAMUSCULAR | Status: DC
  Filled 2023-03-12: qty 0.5

## 2023-03-12 MED ORDER — OXYCODONE HCL 5 MG PO TABS: 5.0000 mg | ORAL_TABLET | ORAL | Status: DC | PRN

## 2023-03-12 MED ORDER — FAMOTIDINE 20 MG PO TABS
20.0000 mg | ORAL_TABLET | Freq: Every day | ORAL | Status: DC
  Administered 2023-03-13: 20 mg via ORAL
  Filled 2023-03-12: qty 1

## 2023-03-12 MED ORDER — ACETAMINOPHEN 325 MG PO TABS: 650.0000 mg | ORAL_TABLET | Freq: Four times a day (QID) | ORAL | Status: DC | PRN

## 2023-03-12 MED ORDER — SENNOSIDES-DOCUSATE SODIUM 8.6-50 MG PO TABS
1.0000 | ORAL_TABLET | Freq: Two times a day (BID) | ORAL | Status: DC
  Administered 2023-03-13: 1 via ORAL
  Filled 2023-03-12 (×2): qty 1

## 2023-03-12 MED ORDER — CELECOXIB 100 MG PO CAPS
100.0000 mg | ORAL_CAPSULE | Freq: Every day | ORAL | Status: DC
  Administered 2023-03-13: 100 mg via ORAL
  Filled 2023-03-12: qty 1

## 2023-03-12 MED ORDER — PNEUMOCOCCAL 20-VAL CONJ VACC 0.5 ML IM SUSY
0.5000 mL | PREFILLED_SYRINGE | INTRAMUSCULAR | Status: DC
  Filled 2023-03-12: qty 0.5

## 2023-03-12 MED ORDER — POLYETHYLENE GLYCOL 3350 17 G PO PACK: 17.0000 g | PACK | Freq: Every day | ORAL | Status: DC | PRN

## 2023-03-12 MED ORDER — PENICILLIN V POTASSIUM 250 MG PO TABS
250.0000 mg | ORAL_TABLET | Freq: Two times a day (BID) | ORAL | Status: DC
  Administered 2023-03-12 – 2023-03-13 (×2): 250 mg via ORAL
  Filled 2023-03-12 (×3): qty 1

## 2023-03-12 MED ORDER — ACETAMINOPHEN 325 MG PO TABS
650.0000 mg | ORAL_TABLET | Freq: Once | ORAL | Status: AC
  Administered 2023-03-12: 650 mg via ORAL
  Filled 2023-03-12: qty 2

## 2023-03-12 MED ORDER — GABAPENTIN 300 MG PO CAPS
600.0000 mg | ORAL_CAPSULE | Freq: Three times a day (TID) | ORAL | Status: DC
  Administered 2023-03-12 – 2023-03-13 (×2): 600 mg via ORAL
  Filled 2023-03-12 (×2): qty 2

## 2023-03-12 MED ORDER — SODIUM CHLORIDE 0.9% IV SOLUTION: Freq: Once | INTRAVENOUS | Status: AC

## 2023-03-12 MED ORDER — KETOROLAC TROMETHAMINE 15 MG/ML IJ SOLN: 15.0000 mg | Freq: Four times a day (QID) | INTRAMUSCULAR | Status: DC | PRN

## 2023-03-12 MED ORDER — VITAMIN D3 25 MCG (1000 UNIT) PO TABS
1000.0000 [IU] | ORAL_TABLET | Freq: Every day | ORAL | Status: DC
  Administered 2023-03-13: 1000 [IU] via ORAL
  Filled 2023-03-12: qty 1

## 2023-03-12 MED ORDER — ONDANSETRON HCL 4 MG/2ML IJ SOLN
4.0000 mg | Freq: Once | INTRAMUSCULAR | Status: AC
  Administered 2023-03-12: 4 mg via INTRAVENOUS
  Filled 2023-03-12: qty 2

## 2023-03-12 MED ORDER — DIPHENHYDRAMINE HCL 50 MG/ML IJ SOLN
25.0000 mg | Freq: Once | INTRAMUSCULAR | Status: AC
  Administered 2023-03-12: 25 mg via INTRAVENOUS
  Filled 2023-03-12: qty 1

## 2023-03-12 NOTE — Plan of Care (Signed)
  Problem: Education: Goal: Knowledge of vaso-occlusive preventative measures will improve Outcome: Progressing Goal: Awareness of infection prevention will improve Outcome: Progressing Goal: Awareness of signs and symptoms of anemia will improve Outcome: Progressing Goal: Long-term complications will improve Outcome: Progressing   

## 2023-03-12 NOTE — ED Provider Notes (Signed)
[place] EMERGENCY DEPARTMENT AT [facility] Provider Note   CSN: [id number] Arrival date & time: 03/12/23  1139     History {Add pertinent medical, surgical, social history, OB history to HPI:1} Chief Complaint  Patient presents with   Abnormal Lab    Sarah Patterson is a 22 y.o. female with PMH as listed below who presents with abnormal lab.   Per chart review recent sickle cell pain crisis and anemia requiring admission from 02/12/2023 to 02/17/2023 with hemoglobin nadir of 5.3 on 9/10.  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   Patient does state that she did receive 2U PRBCs while admitted. Since then she has felt intermittently dizzy but mostly just significantly fatigued. Had labs checked yesterday as part of o/p f/u and had Hgb 4.1. Pt was instructed to come to ED. She denies any sxs of sickle cell crisis currently, no pain, SOB, CP, N/V. Denies hematuria, hematochezia/melena. Did have one episode of self-limited epistaxis earlier this week.   Past Medical History:  Diagnosis Date   Sickle cell anemia (HCC)        Home Medications Prior to Admission medications   Medication Sig Start Date End Date Taking? Authorizing Provider  acetaminophen (TYLENOL) 500 MG tablet Take 500-1,000 mg by mouth every 6 (six) hours as needed for mild pain, moderate pain or headache.    [provider]  ALEVE 220 MG tablet Take 220-440 mg by mouth as needed (for pain, headaches).    [provider]  celecoxib (CELEBREX) 100 MG capsule Take 100 mg by mouth daily. 01/02/21   [provider]  Cholecalciferol (VITAMIN D3) 1000 units CAPS Take 1,000 Units by mouth daily.    [provider]  famotidine (PEPCID) 20 MG tablet Take 20 mg by mouth daily. 06/11/22   [provider]  gabapentin (NEURONTIN) 300 MG capsule Take 600 mg by mouth 3 (three)  times daily. 02/17/21   [provider]  hydroxyurea (HYDREA) 500 MG capsule Take 1,500 mg by mouth at bedtime. 03/07/21   [provider]  lisinopril (ZESTRIL) 5 MG tablet Take 5 mg by mouth daily. 02/17/21   [provider]  magnesium oxide (MAG-OX) 400 MG tablet Take 400 mg by mouth daily. 05/26/17   [provider]  ondansetron (ZOFRAN) 4 MG tablet Take 4 mg by mouth as needed for nausea or vomiting. 06/11/22   [provider]  oxyCODONE (OXY IR/ROXICODONE) 5 MG immediate release tablet Take 1 tablet (5 mg total) by mouth as needed for severe pain (during a crisis). 02/17/23   Massie Maroon, FNP  penicillin v potassium (VEETID) 250 MG tablet Take 250 mg by mouth 2 (two) times daily. 01/27/21   [provider]  SF 5000 PLUS 1.1 % CREA dental cream Place 1 Application onto teeth at bedtime. 06/04/22   [provider]      Allergies    Patient has no known allergies.    Review of Systems   Review of Systems A 10 point review of systems was performed and is negative unless otherwise reported in HPI.  Physical Exam Updated Vital Signs BP (!) 113/98 (BP Location: Left Arm)   Pulse 78   Temp 98.2 F (36.8 C) (Oral)   Resp 16   SpO2 99%  Physical Exam General: Normal appearing female, lying in bed.  HEENT: Sclera anicteric, MMM, trachea midline.  Cardiology: RRR, no murmurs/rubs/gallops.  Resp: Normal respiratory rate and effort. CTAB, no wheezes, rhonchi, crackles.  Abd: Soft, non-tender, non-distended. No rebound tenderness or guarding.  GU: Deferred. MSK: No peripheral edema or signs of trauma. Skin: warm, dry.  Neuro: A&Ox4, CNs II-XII grossly intact. MAEs. Sensation grossly intact.  Psych: Normal mood and affect.   ED Results / Procedures / Treatments   Labs (all labs ordered are listed, but only abnormal results are displayed) Labs Reviewed  CBC WITH DIFFERENTIAL/PLATELET - Abnormal; Notable for the following  components:      Result Value   RBC 0.92 (*)    Hemoglobin 4.2 (*)    HCT 11.8 (*)    MCV 128.3 (*)    MCH 45.7 (*)    nRBC 171.5 (*)    All other components within normal limits  COMPREHENSIVE METABOLIC PANEL - Abnormal; Notable for the following components:   Sodium 134 (*)    Calcium 8.7 (*)    Total Protein 8.6 (*)    Total Bilirubin 1.9 (*)    All other components within normal limits  LACTATE DEHYDROGENASE - Abnormal; Notable for the following components:   LDH 488 (*)    All other components within normal limits  RETICULOCYTES  TYPE AND SCREEN    EKG None  Radiology No results found.  Procedures Procedures  {Document cardiac monitor, telemetry assessment procedure when appropriate:1}  Medications Ordered in ED Medications  ondansetron (ZOFRAN) injection 4 mg (4 mg Intravenous Given 03/12/23 1301)    ED Course/ Medical Decision Making/ A&P                          Medical Decision Making Amount and/or Complexity of Data Reviewed Labs: ordered.  Risk Prescription drug management. Decision regarding hospitalization.    This patient presents to the ED for concern of low Hgb, this involves an extensive number of treatment options, and is a complaint that carries with it a high risk of complications and morbidity.  I considered the following differential and admission for this acute, potentially life threatening condition.   MDM:    Pt with sickle cell, concern for low hemoglobin. No recent hemorrhage - did have self-limited epistaxis likely not the cause of low hgb. No CP, SOB - does feel fatigued. Will recheck Hgb. LDH is lower than last week. She does not feel like she has crisis now. Her reticulocytes are appropriate   Clinical Course as of 03/12/23 1436  Fri Mar 12, 2023  1417 Hgb 4.2. Will consult to sickle cell provider. [HN]    Clinical Course User Index [HN] Loetta Rough, MD    Labs: I Ordered, and personally interpreted labs.  The  pertinent results include:  those listed above  Additional history obtained from chart review.   Reevaluation: After the interventions noted above, I reevaluated the patient and found that they have :{resolved/improved/worsened:23923::"improved"}  Social Determinants of Health: Lives independently  Disposition:  ***  Co morbidities that complicate the patient evaluation  Past Medical History:  Diagnosis Date   Sickle cell anemia (HCC)      Medicines Meds ordered this encounter  Medications   ondansetron (ZOFRAN) injection 4 mg    I have reviewed the patients home medicines and have made adjustments as needed  Problem List / ED Course: Problem List Items Addressed This Visit   None        {Document critical care time when appropriate:1} {Document review of labs and clinical decision  tools ie heart score, Chads2Vasc2 etc:1}  {Document your independent review of radiology images, and any outside records:1} {Document your discussion with family members, caretakers, and with consultants:1} {Document social determinants of health affecting pt's care:1} {Document your decision making why or why not admission, treatments were needed:1}  This note was created using dictation software, which may contain spelling or grammatical errors.

## 2023-03-12 NOTE — H&P (Signed)
H&P  Patient Demographics:  Sarah Patterson, is a 22 y.o. female  MRN: [id number]   DOB - 2001/06/02  Admit Date - 03/12/2023  Outpatient Primary MD for the patient is Pcp, No  Chief Complaint  Patient presents with   Abnormal Lab      HPI:   Sarah Patterson  is a 22 y.o. female with a medical history significant for sickle cell disease that presented to the emergency department with symptoms of fatigue.  Patient was sent for routine labs by her hematology team on yesterday.  It was found that patient's hemoglobin was 4.1.  Hematologist recommended the patient report to the closest emergency department.  Today, patient's hemoglobin is 4.2 g/dL.  Patient states that she has been feeling tired over the past several days, but chalked it up to going to class.  The patient is a Archivist at [employer].  Patient denies any blurry vision, dizziness, shortness of breath, or chest pain.  No urinary symptoms, nausea, vomiting, or diarrhea.  No sick contacts or recent travel.   ER course: Patient's complete blood count was notable for hemoglobin of 4.2 g/dL, which is below her baseline.  Platelets 208,000.  WBCs 7.7.  Complete metabolic panel shows total bilirubin at 1.9, otherwise unremarkable.  LDH elevated at 488, which is improved from previous.  Reticulocyte percentage is 16.2.  Patient is not having any pain at this time. Vital signs show:BP 110/69 (BP Location: Left Arm)   Pulse 83   Temp 98 F (36.7 C) (Oral)   Resp 18   SpO2 91%     Review of systems:  In addition to the HPI above, patient reports No fever or chills No Headache, No changes with vision or hearing No problems swallowing food or liquids No chest pain, cough or shortness of breath No abdominal pain, No nausea or vomiting, Bowel movements are regular No blood in stool or urine No dysuria No new skin rashes or bruises No new joints pains-aches No new weakness, tingling, numbness in any extremity No recent weight gain or loss No  polyuria, polydypsia or polyphagia No significant Mental Stressors .  With Past History of the following :   Past Medical History:  Diagnosis Date   Sickle cell anemia (HCC)       History reviewed. No pertinent surgical history.   Social History:   Social History   Tobacco Use   Smoking status: Never    Passive exposure: Never   Smokeless tobacco: Never  Substance Use Topics   Alcohol use: Never     Lives - At home   Family History :   History reviewed. No pertinent family history.   Home Medications:   Prior to Admission medications   Medication Sig Start Date End Date Taking? Authorizing Provider  acetaminophen (TYLENOL) 500 MG tablet Take 500-1,000 mg by mouth every 6 (six) hours as needed for mild pain, moderate pain or headache.    [provider]  ALEVE 220 MG tablet Take 220-440 mg by mouth as needed (for pain, headaches).    [provider]  celecoxib (CELEBREX) 100 MG capsule Take 100 mg by mouth daily. 01/02/21   [provider]  Cholecalciferol (VITAMIN D3) 1000 units CAPS Take 1,000 Units by mouth daily.    [provider]  famotidine (PEPCID) 20 MG tablet Take 20 mg by mouth daily. 06/11/22   [provider]  gabapentin (NEURONTIN) 300 MG capsule Take 600 mg by mouth 3 (three) times daily.  02/17/21   [provider]  hydroxyurea (HYDREA) 500 MG capsule Take 1,500 mg by mouth at bedtime. 03/07/21   [provider]  lisinopril (ZESTRIL) 5 MG tablet Take 5 mg by mouth daily. 02/17/21   [provider]  magnesium oxide (MAG-OX) 400 MG tablet Take 400 mg by mouth daily. 05/26/17   [provider]  ondansetron (ZOFRAN) 4 MG tablet Take 4 mg by mouth as needed for nausea or vomiting. 06/11/22   [provider]  oxyCODONE (OXY IR/ROXICODONE) 5 MG immediate release tablet Take 1 tablet (5 mg total) by mouth as needed for severe pain (during a crisis). 02/17/23   Massie Maroon, FNP   penicillin v potassium (VEETID) 250 MG tablet Take 250 mg by mouth 2 (two) times daily. 01/27/21   [provider]  SF 5000 PLUS 1.1 % CREA dental cream Place 1 Application onto teeth at bedtime. 06/04/22   [provider]     Allergies:   No Known Allergies   Physical Exam:   Vitals:   Vitals:   03/12/23 1430 03/12/23 1537  BP: (!) 113/98 110/69  Pulse: 78 83  Resp: 16 18  Temp:  98 F (36.7 C)  SpO2: 99% 91%    Physical Exam: Constitutional: Patient appears well-developed and well-nourished. Not in obvious distress. HENT: Normocephalic, atraumatic, External right and left ear normal. Oropharynx is clear and moist.  Eyes: Conjunctivae and EOM are normal. PERRLA, no scleral icterus. Neck: Normal ROM. Neck supple. No JVD. No tracheal deviation. No thyromegaly. CVS: RRR, S1/S2 +, no murmurs, no gallops, no carotid bruit.  Pulmonary: Effort and breath sounds normal, no stridor, rhonchi, wheezes, rales.  Abdominal: Soft. BS +, no distension, tenderness, rebound or guarding.  Musculoskeletal: Normal range of motion. No edema and no tenderness.  Lymphadenopathy: No lymphadenopathy noted, cervical, inguinal or axillary Neuro: Alert. Normal reflexes, muscle tone coordination. No cranial nerve deficit. Skin: Skin is warm and dry. No rash noted. Not diaphoretic. No erythema. No pallor. Psychiatric: Normal mood and affect. Behavior, judgment, thought content normal.   Data Review:   CBC Recent Labs  Lab 03/12/23 1245  WBC 7.7  HGB 4.2*  HCT 11.8*  PLT 208  MCV 128.3*  MCH 45.7*  MCHC 35.6  RDW Not Measured  LYMPHSABS 2.6  MONOABS 0.7  EOSABS 0.2  BASOSABS 0.0   ------------------------------------------------------------------------------------------------------------------  Chemistries  Recent Labs  Lab 03/12/23 1245  NA 134*  K 3.8  CL 105  CO2 23  GLUCOSE 85  BUN 6  CREATININE 0.61  CALCIUM 8.7*  AST 35  ALT 14  ALKPHOS 71  BILITOT  1.9*   ------------------------------------------------------------------------------------------------------------------ CrCl cannot be calculated (Unknown ideal weight.). ------------------------------------------------------------------------------------------------------------------ No results for input(s): "TSH", "T4TOTAL", "T3FREE", "THYROIDAB" in the last 72 hours.  Invalid input(s): "FREET3"  Coagulation profile No results for input(s): "INR", "PROTIME" in the last 168 hours. ------------------------------------------------------------------------------------------------------------------- No results for input(s): "DDIMER" in the last 72 hours. -------------------------------------------------------------------------------------------------------------------  Cardiac Enzymes No results for input(s): "CKMB", "TROPONINI", "MYOGLOBIN" in the last 168 hours.  Invalid input(s): "CK" ------------------------------------------------------------------------------------------------------------------ No results found for: "BNP"  ---------------------------------------------------------------------------------------------------------------  Urinalysis    Component Value Date/Time   COLORURINE YELLOW 02/12/2023 1008   APPEARANCEUR CLEAR 02/12/2023 1008   LABSPEC 1.020 02/12/2023 1008   PHURINE 7.5 02/12/2023 1008   GLUCOSEU NEGATIVE 02/12/2023 1008   HGBUR NEGATIVE 02/12/2023 1008   BILIRUBINUR NEGATIVE 02/12/2023 1008   KETONESUR NEGATIVE 02/12/2023 1008   PROTEINUR NEGATIVE 02/12/2023 1008   NITRITE  NEGATIVE 02/12/2023 1008   LEUKOCYTESUR NEGATIVE 02/12/2023 1008    ----------------------------------------------------------------------------------------------------------------   Imaging Results:    No results found.   Assessment & Plan:  Principal Problem:   Symptomatic anemia Active Problems:   Sickle cell disease (HCC)  Symptomatic anemia: On admission, patient's  hemoglobin is 4.2 g/dL.  Transfused 2 units PRBCs overnight.  Follow labs in AM.  Continue folic acid 1 mg daily.  Hold hydroxyurea.  Sickle cell disease: Patient is not having any pain at this time.  Toradol 15 mg every 6 hours as needed for mild to moderate pain Oxycodone 5 mg every 4 hours as needed for moderate to severe pain.  Family Communication: Admission, patient's condition and plan of care including tests being ordered have been discussed with the patient who indicate understanding and agree with the plan and Code Status.  Code Status: Full Code  Consults called: None    Admission status: Inpatient    Time spent in minutes : 30 minutes  Nolon Nations  APRN, MSN, FNP-C Patient Care [facility] [address] [place], [place] [address] [phone]  03/12/2023 at 6:41 PM

## 2023-03-12 NOTE — Progress Notes (Signed)
Pt arrived to room 1602 via wheelchair from the ED. See assessment.

## 2023-03-12 NOTE — ED Notes (Signed)
ED TO INPATIENT HANDOFF REPORT  ED Nurse Name and Phone #: Crist Infante, RN [phone]  S Name/Age/Gender Thereasa Patterson 22 y.o. female Room/Bed: WA14/WA14  Code Status   Code Status: Full Code  Home/SNF/Other Home Patient oriented to: self, place, time, and situation Is this baseline? Yes   Triage Complete: Triage complete  Chief Complaint Symptomatic anemia [D64.9]  Triage Note Patient has sickle cell. Yesterday she had labs drawn, and this morning found her hemoglobin was 4.1. She was told to come here to get retested and a possible infusion. Patient has been symptomatic. She reports episodes of weakness, fatigue and dizziness.    Allergies No Known Allergies  Level of Care/Admitting Diagnosis ED Disposition     ED Disposition  Admit   Condition  --   Comment  Hospital Area: [facility] [[id number]]  Level of Care: Telemetry [5]  Admit to tele based on following criteria: Complex arrhythmia (Bradycardia/Tachycardia)  May place patient in observation at [facility] or [facility] if equivalent level of care is available:: No  Covid Evaluation: Asymptomatic - no recent exposure (last 10 days) testing not required  Diagnosis: Symptomatic anemia [4540981]  Admitting Physician: Massie Maroon [[id number]]  Attending Physician: Quentin Angst [[id number]]          B Medical/Surgery History Past Medical History:  Diagnosis Date   Sickle cell anemia (HCC)    History reviewed. No pertinent surgical history.   A IV Location/Drains/Wounds Patient Lines/Drains/Airways Status     Active Line/Drains/Airways     Name Placement date Placement time Site Days   Peripheral IV 03/12/23 20 G Anterior;Right Forearm 03/12/23  1257  Forearm  less than 1            Intake/Output Last 24 hours No intake or output data in the 24 hours ending 03/12/23 1502  Labs/Imaging Results for orders placed or performed during the hospital encounter of 03/12/23 (from the past  48 hour(s))  CBC with Differential     Status: Abnormal   Collection Time: 03/12/23 12:45 PM  Result Value Ref Range   WBC 7.7 4.0 - 10.5 K/uL   RBC 0.92 (L) 3.87 - 5.11 MIL/uL   Hemoglobin 4.2 (LL) 12.0 - 15.0 g/dL    Comment: REPEATED TO VERIFY THIS CRITICAL RESULT HAS VERIFIED AND BEEN CALLED TO TIA B RN BY CONEKIN,BETHANY ON 10 04 2024 AT 1415, AND HAS BEEN READ BACK.     HCT 11.8 (L) 36.0 - 46.0 %   MCV 128.3 (H) 80.0 - 100.0 fL   MCH 45.7 (H) 26.0 - 34.0 pg   MCHC 35.6 30.0 - 36.0 g/dL   RDW Not Measured 30.8 - 15.5 %   Platelets 208 150 - 400 K/uL   nRBC 171.5 (H) 0.0 - 0.2 %   Neutrophils Relative % 55 %   Neutro Abs 4.2 1.7 - 7.7 K/uL   Lymphocytes Relative 34 %   Lymphs Abs 2.6 0.7 - 4.0 K/uL   Monocytes Relative 9 %   Monocytes Absolute 0.7 0.1 - 1.0 K/uL   Eosinophils Relative 2 %   Eosinophils Absolute 0.2 0.0 - 0.5 K/uL   Basophils Relative 0 %   Basophils Absolute 0.0 0.0 - 0.1 K/uL   Abs Immature Granulocytes 0.00 0.00 - 0.07 K/uL   Polychromasia PRESENT    Target Cells PRESENT    Stomatocytes PRESENT     Comment: Performed at [facility], [address], [place], [place] [address]  Comprehensive metabolic panel     Status: Abnormal   Collection Time: 03/12/23 12:45 PM  Result Value Ref Range   Sodium 134 (L) 135 - 145 mmol/L   Potassium 3.8 3.5 - 5.1 mmol/L   Chloride 105 98 - 111 mmol/L   CO2 23 22 - 32 mmol/L   Glucose, Bld 85 70 - 99 mg/dL    Comment: Glucose reference range applies only to samples taken after fasting for at least 8 hours.   BUN 6 6 - 20 mg/dL   Creatinine, Ser 0.98 0.44 - 1.00 mg/dL   Calcium 8.7 (L) 8.9 - 10.3 mg/dL   Total Protein 8.6 (H) 6.5 - 8.1 g/dL   Albumin 3.8 3.5 - 5.0 g/dL   AST 35 15 - 41 U/L   ALT 14 0 - 44 U/L   Alkaline Phosphatase 71 38 - 126 U/L   Total Bilirubin 1.9 (H) 0.3 - 1.2 mg/dL   GFR, Estimated >11 >91 mL/min    Comment: (NOTE) Calculated using the CKD-EPI Creatinine Equation  (2021)    Anion gap 6 5 - 15    Comment: Performed at [facility], [address], [place], [place] [address]  Lactate dehydrogenase     Status: Abnormal   Collection Time: 03/12/23 12:45 PM  Result Value Ref Range   LDH 488 (H) 98 - 192 U/L    Comment: Performed at [facility], [address], [place], [place] [address]  Type and screen [facility]     Status: None (Preliminary result)   Collection Time: 03/12/23 12:45 PM  Result Value Ref Range   ABO/RH(D) PENDING    Antibody Screen PENDING    Sample Expiration      03/15/2023,2359 Performed at [facility], [address] [place], [place] [address]    No results found.  Pending Labs Unresulted Labs (From admission, onward)     Start     Ordered   03/12/23 1254  Reticulocytes  Once,   URGENT        03/12/23 1253   Signed and Held  Basic metabolic panel  Tomorrow morning,   R        Signed and Held   Signed and Held  CBC  Daily,   R      Signed and Held            Vitals/Pain Today's Vitals   03/12/23 1145 03/12/23 1149 03/12/23 1358 03/12/23 1430  BP: 136/72  109/66 (!) 113/98  Pulse: 94  87 78  Resp: 16  16 16   Temp: 98.2 F (36.8 C)     TempSrc: Oral     SpO2: 98%  100% 99%  PainSc:  0-No pain      Isolation Precautions No active isolations  Medications Medications  ondansetron (ZOFRAN) injection 4 mg (4 mg Intravenous Given 03/12/23 1301)    Mobility walks     Focused Assessments Neuro Assessment Handoff:  Swallow screen pass?  N/A Cardiac Rhythm: Normal sinus rhythm       Neuro Assessment: Within Defined Limits Neuro Checks:      Has TPA been given? No If patient is a Neuro Trauma and patient is going to OR before floor call report to 4N Charge nurse: [phone] or [phone]   R Recommendations: See Admitting Provider Note  Report given to:   Additional Notes:

## 2023-03-12 NOTE — ED Triage Notes (Addendum)
Patient has sickle cell. Yesterday she had labs drawn, and this morning found her hemoglobin was 4.1. She was told to come here to get retested and a possible infusion. Patient has been symptomatic. She reports episodes of weakness, fatigue and dizziness.

## 2023-03-13 DIAGNOSIS — D649 Anemia, unspecified: Secondary | ICD-10-CM | POA: Diagnosis not present

## 2023-03-13 LAB — BASIC METABOLIC PANEL
Anion gap: 10 (ref 5–15)
BUN: 7 mg/dL (ref 6–20)
CO2: 22 mmol/L (ref 22–32)
Calcium: 8.6 mg/dL — ABNORMAL LOW (ref 8.9–10.3)
Chloride: 104 mmol/L (ref 98–111)
Creatinine, Ser: 0.5 mg/dL (ref 0.44–1.00)
GFR, Estimated: >60 mL/min (ref >60)
Glucose, Bld: 90 mg/dL (ref 70–99)
Potassium: 3.6 mmol/L (ref 3.5–5.1)
Sodium: 136 mmol/L (ref 135–145)

## 2023-03-13 LAB — CBC
HCT: 20.2 % — ABNORMAL LOW (ref 36.0–46.0)
Hemoglobin: 7.5 g/dL — ABNORMAL LOW (ref 12.0–15.0)
MCH: 41.4 pg — ABNORMAL HIGH (ref 26.0–34.0)
MCHC: 37.1 g/dL — ABNORMAL HIGH (ref 30.0–36.0)
MCV: 111.6 fL — ABNORMAL HIGH (ref 80.0–100.0)
Platelets: 150 10*3/uL (ref 150–400)
RBC: 1.81 MIL/uL — ABNORMAL LOW (ref 3.87–5.11)
RDW: 27.4 % — ABNORMAL HIGH (ref 11.5–15.5)
WBC: 5.6 10*3/uL (ref 4.0–10.5)
nRBC: 186.9 % — ABNORMAL HIGH (ref 0.0–0.2)

## 2023-03-13 MED ORDER — HYDROXYUREA 500 MG PO CAPS: 1500.0000 mg | ORAL_CAPSULE | Freq: Every day | ORAL | Status: DC

## 2023-03-13 NOTE — Hospital Course (Signed)
Patient was admitted.  Hemoglobin was 4.2.  Transfuse 1 unit of packed red blood cells.  Hemoglobin went off to 7.5 g patient was pain-free.  Did not require any Dilaudid PCA.  Patient also did not require any other treatments.  She is feeling better and wants to go back to school so she was discharged to resume her home regimen

## 2023-03-13 NOTE — Discharge Summary (Incomplete Revision)
Physician Discharge Summary   Patient: Sarah Patterson MRN: [id number] DOB: Jun 18, 2000  Admit date:     03/12/2023  Discharge date: 03/13/2023  Discharge Physician: Lonia Blood   PCP: Pcp, No   Recommendations at discharge:   Resume home regimen and take your home medications  Discharge Diagnoses: Principal Problem:   Symptomatic anemia Active Problems:   Sickle cell disease (HCC)  Resolved Problems:   * No resolved hospital problems. [facility] Course: Patient was admitted.  Hemoglobin was 4.2.  Transfuse 1 unit of packed red blood cells.  Hemoglobin went off to 7.5 g patient was pain-free.  Did not require any Dilaudid PCA.  Patient also did not require any other treatments.  She is feeling better and wants to go back to school so she was discharged to resume her home regimen  Assessment and Plan:         Consultants: None Procedures performed: Blood transfusion Disposition: Home Diet recommendation:  Discharge Diet Orders (From admission, onward)     Start     Ordered   03/13/23 0000  Diet - low sodium heart healthy        03/13/23 1500           Regular diet DISCHARGE MEDICATION: Allergies as of 03/13/2023   No Known Allergies      Medication List     TAKE these medications    acetaminophen 500 MG tablet Commonly known as: TYLENOL Take 500-1,000 mg by mouth every 6 (six) hours as needed for mild pain, moderate pain or headache.   Aleve 220 MG tablet Generic drug: naproxen sodium Take 220-440 mg by mouth as needed (for pain, headaches).   celecoxib 100 MG capsule Commonly known as: CELEBREX Take 100 mg by mouth daily.   famotidine 20 MG tablet Commonly known as: PEPCID Take 20 mg by mouth daily.   gabapentin 300 MG capsule Commonly known as: NEURONTIN Take 600 mg by mouth 3 (three) times daily.   hydroxyurea 500 MG capsule Commonly known as: HYDREA Take 1,500 mg by mouth at bedtime.   lisinopril 5 MG tablet Commonly known as:  ZESTRIL Take 5 mg by mouth daily.   magnesium oxide 400 MG tablet Commonly known as: MAG-OX Take 400 mg by mouth daily.   ofloxacin 0.3 % ophthalmic solution Commonly known as: OCUFLOX Place 1 drop into both eyes every 4 (four) hours.   ondansetron 4 MG tablet Commonly known as: ZOFRAN Take 4 mg by mouth as needed for nausea or vomiting.   oxyCODONE 5 MG immediate release tablet Commonly known as: Oxy IR/ROXICODONE Take 1 tablet (5 mg total) by mouth as needed for severe pain (during a crisis).   penicillin v potassium 250 MG tablet Commonly known as: VEETID Take 250 mg by mouth 2 (two) times daily.   SF 5000 Plus 1.1 % Crea dental cream Generic drug: sodium fluoride Place 1 Application onto teeth at bedtime.   Vitamin D3 1000 units Caps Take 1,000 Units by mouth daily.        Discharge Exam: Filed Weights   03/12/23 1830  Weight: 60.1 kg   Constitutional: NAD, calm, comfortable Eyes: PERRL, lids and conjunctivae normal ENMT: Mucous membranes are moist. Posterior pharynx clear of any exudate or lesions.Normal dentition.  Neck: normal, supple, no masses, no thyromegaly Respiratory: clear to auscultation bilaterally, no wheezing, no crackles. Normal respiratory effort. No accessory muscle use.  Cardiovascular: Regular rate and rhythm, no murmurs / rubs / gallops. No extremity edema.  2+ pedal pulses. No carotid bruits.  Abdomen: no tenderness, no masses palpated. No hepatosplenomegaly. Bowel sounds positive.  Musculoskeletal: Good range of motion, no joint swelling or tenderness, Skin: no rashes, lesions, ulcers. No induration Neurologic: CN 2-12 grossly intact. Sensation intact, DTR normal. Strength 5/5 in all 4.  Psychiatric: Normal judgment and insight. Alert and oriented x 3. Normal mood   Condition at discharge: good  The results of significant diagnostics from this hospitalization (including imaging, microbiology, ancillary and laboratory) are listed below for  reference.   Imaging Studies: VAS [place] LOWER EXTREMITY VENOUS (DVT)  Result Date: 02/14/2023  Lower Venous DVT Study Patient Name:  RUTHER EPHRAIM  Date of Exam:   02/13/2023 Medical Rec #: [id number]   Accession #:    [id number] Date of Birth: 09/08/2000  Patient Gender: F Patient Age:   22 years Exam Location:  [facility] Procedure:      VAS [place] LOWER EXTREMITY VENOUS (DVT) Referring Phys: ABIGAIL CHAVEZ --------------------------------------------------------------------------------  Indications: Pain.  Risk Factors: None identified. Limitations: Patient pain tolerance. Comparison Study: No prior studies. Performing Technologist: Chanda Busing RVT  Examination Guidelines: A complete evaluation includes B-mode imaging, spectral Doppler, color Doppler, and power Doppler as needed of all accessible portions of each vessel. Bilateral testing is considered an integral part of a complete examination. Limited examinations for reoccurring indications may be performed as noted. The reflux portion of the exam is performed with the patient in reverse Trendelenburg.  +---------+---------------+---------+-----------+----------+--------------+ RIGHT    CompressibilityPhasicitySpontaneityPropertiesThrombus Aging +---------+---------------+---------+-----------+----------+--------------+ CFV      Full           Yes      Yes                                 +---------+---------------+---------+-----------+----------+--------------+ SFJ      Full                                                        +---------+---------------+---------+-----------+----------+--------------+ FV Prox  Full                                                        +---------+---------------+---------+-----------+----------+--------------+ FV Mid   Full                                                        +---------+---------------+---------+-----------+----------+--------------+ FV DistalFull                                                         +---------+---------------+---------+-----------+----------+--------------+ PFV      Full                                                        +---------+---------------+---------+-----------+----------+--------------+  POP      Full           Yes      Yes                                 +---------+---------------+---------+-----------+----------+--------------+ PTV      Full                                                        +---------+---------------+---------+-----------+----------+--------------+ PERO     Full                                                        +---------+---------------+---------+-----------+----------+--------------+   +---------+---------------+---------+-----------+----------+--------------+ LEFT     CompressibilityPhasicitySpontaneityPropertiesThrombus Aging +---------+---------------+---------+-----------+----------+--------------+ CFV      Full           Yes      Yes                                 +---------+---------------+---------+-----------+----------+--------------+ SFJ      Full                                                        +---------+---------------+---------+-----------+----------+--------------+ FV Prox  Full                                                        +---------+---------------+---------+-----------+----------+--------------+ FV Mid   Full                                                        +---------+---------------+---------+-----------+----------+--------------+ FV DistalFull                                                        +---------+---------------+---------+-----------+----------+--------------+ PFV      Full                                                        +---------+---------------+---------+-----------+----------+--------------+ POP      Full           Yes      Yes                                  +---------+---------------+---------+-----------+----------+--------------+  PTV      Full                                                        +---------+---------------+---------+-----------+----------+--------------+ PERO     Full                                                        +---------+---------------+---------+-----------+----------+--------------+     Summary: RIGHT: - There is no evidence of deep vein thrombosis in the lower extremity.  - No cystic structure found in the popliteal fossa.  LEFT: - There is no evidence of deep vein thrombosis in the lower extremity.  - No cystic structure found in the popliteal fossa.  *See table(s) above for measurements and observations. Electronically signed by Heath Lark on 02/14/2023 at 11:14:52 AM.    Final    DG Chest Port 1 View  Result Date: 02/12/2023 CLINICAL DATA:  sickle cell.  Acute chest pain EXAM: PORTABLE CHEST 1 VIEW COMPARISON:  06/15/2022. FINDINGS: Bilateral lung fields are clear. Bilateral costophrenic angles are clear. Stable cardio-mediastinal silhouette. No acute osseous abnormalities. The soft tissues are within normal limits. IMPRESSION: 1. No active disease. Electronically Signed   By: Jules Schick M.D.   On: 02/12/2023 08:55    Microbiology: Results for orders placed or performed during the hospital encounter of 02/12/23  SARS Coronavirus 2 by RT PCR (hospital order, performed in [facility] hospital lab) *cepheid single result test* Anterior Nasal Swab     Status: None   Collection Time: 02/12/23  7:59 AM   Specimen: Anterior Nasal Swab  Result Value Ref Range Status   SARS Coronavirus 2 by RT PCR NEGATIVE NEGATIVE Final    Comment: (NOTE) SARS-CoV-2 target nucleic acids are NOT DETECTED.  The SARS-CoV-2 RNA is generally detectable in upper and lower respiratory specimens during the acute phase of infection. The lowest concentration of SARS-CoV-2 viral copies this assay can detect is 250 copies / mL. A  negative result does not preclude SARS-CoV-2 infection and should not be used as the sole basis for treatment or other patient management decisions.  A negative result may occur with improper specimen collection / handling, submission of specimen other than nasopharyngeal swab, presence of viral mutation(s) within the areas targeted by this assay, and inadequate number of viral copies (<250 copies / mL). A negative result must be combined with clinical observations, patient history, and epidemiological information.  Fact Sheet for Patients:   RoadLapTop.co.za  Fact Sheet for Healthcare Providers: http://kim-miller.com/  This test is not yet approved or  cleared by the [place] FDA and has been authorized for detection and/or diagnosis of SARS-CoV-2 by FDA under an Emergency Use Authorization (EUA).  This EUA will remain in effect (meaning this test can be used) for the duration of the COVID-19 declaration under Section 564(b)(1) of the Act, 21 U.S.C. section 360bbb-3(b)(1), unless the authorization is terminated or revoked sooner.  Performed at [facility], [address] [place], [place] [address]     Labs: CBC: Recent Labs  Lab 03/12/23 1245 03/13/23 0614  WBC 7.7 5.6  NEUTROABS 4.2  --  HGB 4.2* 7.5*  HCT 11.8* 20.2*  MCV 128.3* 111.6*  PLT 208 150   Basic Metabolic Panel: Recent Labs  Lab 03/12/23 1245 03/13/23 0614  NA 134* 136  K 3.8 3.6  CL 105 104  CO2 23 22  GLUCOSE 85 90  BUN 6 7  CREATININE 0.61 0.50  CALCIUM 8.7* 8.6*   Liver Function Tests: Recent Labs  Lab 03/12/23 1245  AST 35  ALT 14  ALKPHOS 71  BILITOT 1.9*  PROT 8.6*  ALBUMIN 3.8   CBG: No results for input(s): "GLUCAP" in the last 168 hours.  Discharge time spent: less than 30 minutes.  SignedLonia Blood, MD [facility] 03/13/2023

## 2023-03-13 NOTE — Discharge Summary (Signed)
Physician Discharge Summary   Patient: Sarah Patterson MRN: [id number] DOB: Jun 18, 2000  Admit date:     03/12/2023  Discharge date: 03/13/2023  Discharge Physician: Lonia Blood   PCP: Pcp, No   Recommendations at discharge:   Resume home regimen and take your home medications  Discharge Diagnoses: Principal Problem:   Symptomatic anemia Active Problems:   Sickle cell disease (HCC)  Resolved Problems:   * No resolved hospital problems. [facility] Course: Patient was admitted.  Hemoglobin was 4.2.  Transfuse 1 unit of packed red blood cells.  Hemoglobin went off to 7.5 g patient was pain-free.  Did not require any Dilaudid PCA.  Patient also did not require any other treatments.  She is feeling better and wants to go back to school so she was discharged to resume her home regimen  Assessment and Plan:         Consultants: None Procedures performed: Blood transfusion Disposition: Home Diet recommendation:  Discharge Diet Orders (From admission, onward)     Start     Ordered   03/13/23 0000  Diet - low sodium heart healthy        03/13/23 1500           Regular diet DISCHARGE MEDICATION: Allergies as of 03/13/2023   No Known Allergies      Medication List     TAKE these medications    acetaminophen 500 MG tablet Commonly known as: TYLENOL Take 500-1,000 mg by mouth every 6 (six) hours as needed for mild pain, moderate pain or headache.   Aleve 220 MG tablet Generic drug: naproxen sodium Take 220-440 mg by mouth as needed (for pain, headaches).   celecoxib 100 MG capsule Commonly known as: CELEBREX Take 100 mg by mouth daily.   famotidine 20 MG tablet Commonly known as: PEPCID Take 20 mg by mouth daily.   gabapentin 300 MG capsule Commonly known as: NEURONTIN Take 600 mg by mouth 3 (three) times daily.   hydroxyurea 500 MG capsule Commonly known as: HYDREA Take 1,500 mg by mouth at bedtime.   lisinopril 5 MG tablet Commonly known as:  ZESTRIL Take 5 mg by mouth daily.   magnesium oxide 400 MG tablet Commonly known as: MAG-OX Take 400 mg by mouth daily.   ofloxacin 0.3 % ophthalmic solution Commonly known as: OCUFLOX Place 1 drop into both eyes every 4 (four) hours.   ondansetron 4 MG tablet Commonly known as: ZOFRAN Take 4 mg by mouth as needed for nausea or vomiting.   oxyCODONE 5 MG immediate release tablet Commonly known as: Oxy IR/ROXICODONE Take 1 tablet (5 mg total) by mouth as needed for severe pain (during a crisis).   penicillin v potassium 250 MG tablet Commonly known as: VEETID Take 250 mg by mouth 2 (two) times daily.   SF 5000 Plus 1.1 % Crea dental cream Generic drug: sodium fluoride Place 1 Application onto teeth at bedtime.   Vitamin D3 1000 units Caps Take 1,000 Units by mouth daily.        Discharge Exam: Filed Weights   03/12/23 1830  Weight: 60.1 kg   Constitutional: NAD, calm, comfortable Eyes: PERRL, lids and conjunctivae normal ENMT: Mucous membranes are moist. Posterior pharynx clear of any exudate or lesions.Normal dentition.  Neck: normal, supple, no masses, no thyromegaly Respiratory: clear to auscultation bilaterally, no wheezing, no crackles. Normal respiratory effort. No accessory muscle use.  Cardiovascular: Regular rate and rhythm, no murmurs / rubs / gallops. No extremity edema.  2+ pedal pulses. No carotid bruits.  Abdomen: no tenderness, no masses palpated. No hepatosplenomegaly. Bowel sounds positive.  Musculoskeletal: Good range of motion, no joint swelling or tenderness, Skin: no rashes, lesions, ulcers. No induration Neurologic: CN 2-12 grossly intact. Sensation intact, DTR normal. Strength 5/5 in all 4.  Psychiatric: Normal judgment and insight. Alert and oriented x 3. Normal mood   Condition at discharge: good  The results of significant diagnostics from this hospitalization (including imaging, microbiology, ancillary and laboratory) are listed below for  reference.   Imaging Studies: VAS [place] LOWER EXTREMITY VENOUS (DVT)  Result Date: 02/14/2023  Lower Venous DVT Study Patient Name:  Sarah Patterson  Date of Exam:   02/13/2023 Medical Rec #: [id number]   Accession #:    [id number] Date of Birth: 09/08/2000  Patient Gender: F Patient Age:   21 years Exam Location:  [facility] Procedure:      VAS [place] LOWER EXTREMITY VENOUS (DVT) Referring Phys: ABIGAIL CHAVEZ --------------------------------------------------------------------------------  Indications: Pain.  Risk Factors: None identified. Limitations: Patient pain tolerance. Comparison Study: No prior studies. Performing Technologist: Chanda Busing RVT  Examination Guidelines: A complete evaluation includes B-mode imaging, spectral Doppler, color Doppler, and power Doppler as needed of all accessible portions of each vessel. Bilateral testing is considered an integral part of a complete examination. Limited examinations for reoccurring indications may be performed as noted. The reflux portion of the exam is performed with the patient in reverse Trendelenburg.  +---------+---------------+---------+-----------+----------+--------------+ RIGHT    CompressibilityPhasicitySpontaneityPropertiesThrombus Aging +---------+---------------+---------+-----------+----------+--------------+ CFV      Full           Yes      Yes                                 +---------+---------------+---------+-----------+----------+--------------+ SFJ      Full                                                        +---------+---------------+---------+-----------+----------+--------------+ FV Prox  Full                                                        +---------+---------------+---------+-----------+----------+--------------+ FV Mid   Full                                                        +---------+---------------+---------+-----------+----------+--------------+ FV DistalFull                                                         +---------+---------------+---------+-----------+----------+--------------+ PFV      Full                                                        +---------+---------------+---------+-----------+----------+--------------+  POP      Full           Yes      Yes                                 +---------+---------------+---------+-----------+----------+--------------+ PTV      Full                                                        +---------+---------------+---------+-----------+----------+--------------+ PERO     Full                                                        +---------+---------------+---------+-----------+----------+--------------+   +---------+---------------+---------+-----------+----------+--------------+ LEFT     CompressibilityPhasicitySpontaneityPropertiesThrombus Aging +---------+---------------+---------+-----------+----------+--------------+ CFV      Full           Yes      Yes                                 +---------+---------------+---------+-----------+----------+--------------+ SFJ      Full                                                        +---------+---------------+---------+-----------+----------+--------------+ FV Prox  Full                                                        +---------+---------------+---------+-----------+----------+--------------+ FV Mid   Full                                                        +---------+---------------+---------+-----------+----------+--------------+ FV DistalFull                                                        +---------+---------------+---------+-----------+----------+--------------+ PFV      Full                                                        +---------+---------------+---------+-----------+----------+--------------+ POP      Full           Yes      Yes                                  +---------+---------------+---------+-----------+----------+--------------+  PTV      Full                                                        +---------+---------------+---------+-----------+----------+--------------+ PERO     Full                                                        +---------+---------------+---------+-----------+----------+--------------+     Summary: RIGHT: - There is no evidence of deep vein thrombosis in the lower extremity.  - No cystic structure found in the popliteal fossa.  LEFT: - There is no evidence of deep vein thrombosis in the lower extremity.  - No cystic structure found in the popliteal fossa.  *See table(s) above for measurements and observations. Electronically signed by Heath Lark on 02/14/2023 at 11:14:52 AM.    Final    DG Chest Port 1 View  Result Date: 02/12/2023 CLINICAL DATA:  sickle cell.  Acute chest pain EXAM: PORTABLE CHEST 1 VIEW COMPARISON:  06/15/2022. FINDINGS: Bilateral lung fields are clear. Bilateral costophrenic angles are clear. Stable cardio-mediastinal silhouette. No acute osseous abnormalities. The soft tissues are within normal limits. IMPRESSION: 1. No active disease. Electronically Signed   By: Jules Schick M.D.   On: 02/12/2023 08:55    Microbiology: Results for orders placed or performed during the hospital encounter of 02/12/23  SARS Coronavirus 2 by RT PCR (hospital order, performed in [facility] hospital lab) *cepheid single result test* Anterior Nasal Swab     Status: None   Collection Time: 02/12/23  7:59 AM   Specimen: Anterior Nasal Swab  Result Value Ref Range Status   SARS Coronavirus 2 by RT PCR NEGATIVE NEGATIVE Final    Comment: (NOTE) SARS-CoV-2 target nucleic acids are NOT DETECTED.  The SARS-CoV-2 RNA is generally detectable in upper and lower respiratory specimens during the acute phase of infection. The lowest concentration of SARS-CoV-2 viral copies this assay can detect is 250 copies / mL. A  negative result does not preclude SARS-CoV-2 infection and should not be used as the sole basis for treatment or other patient management decisions.  A negative result may occur with improper specimen collection / handling, submission of specimen other than nasopharyngeal swab, presence of viral mutation(s) within the areas targeted by this assay, and inadequate number of viral copies (<250 copies / mL). A negative result must be combined with clinical observations, patient history, and epidemiological information.  Fact Sheet for Patients:   RoadLapTop.co.za  Fact Sheet for Healthcare Providers: http://kim-miller.com/  This test is not yet approved or  cleared by the [place] FDA and has been authorized for detection and/or diagnosis of SARS-CoV-2 by FDA under an Emergency Use Authorization (EUA).  This EUA will remain in effect (meaning this test can be used) for the duration of the COVID-19 declaration under Section 564(b)(1) of the Act, 21 U.S.C. section 360bbb-3(b)(1), unless the authorization is terminated or revoked sooner.  Performed at [facility], [address] [place], [place] [address]     Labs: CBC: Recent Labs  Lab 03/12/23 1245 03/13/23 0614  WBC 7.7 5.6  NEUTROABS 4.2  --  HGB 4.2* 7.5*  HCT 11.8* 20.2*  MCV 128.3* 111.6*  PLT 208 150   Basic Metabolic Panel: Recent Labs  Lab 03/12/23 1245 03/13/23 0614  NA 134* 136  K 3.8 3.6  CL 105 104  CO2 23 22  GLUCOSE 85 90  BUN 6 7  CREATININE 0.61 0.50  CALCIUM 8.7* 8.6*   Liver Function Tests: Recent Labs  Lab 03/12/23 1245  AST 35  ALT 14  ALKPHOS 71  BILITOT 1.9*  PROT 8.6*  ALBUMIN 3.8   CBG: No results for input(s): "GLUCAP" in the last 168 hours.  Discharge time spent: less than 30 minutes.  SignedLonia Blood, MD [facility] 03/13/2023

## 2023-03-14 LAB — CBC WITH DIFFERENTIAL/PLATELET
Abs Immature Granulocytes: 0 10*3/uL (ref 0.00–0.07)
Basophils Absolute: 0 10*3/uL (ref 0.0–0.1)
Basophils Relative: 0 %
Eosinophils Absolute: 0.2 10*3/uL (ref 0.0–0.5)
Eosinophils Relative: 2 %
HCT: 11.8 % — ABNORMAL LOW (ref 36.0–46.0)
Hemoglobin: 4.2 g/dL — CL (ref 12.0–15.0)
Lymphocytes Relative: 34 %
Lymphs Abs: 2.6 10*3/uL (ref 0.7–4.0)
MCH: 45.7 pg — ABNORMAL HIGH (ref 26.0–34.0)
MCHC: 35.6 g/dL (ref 30.0–36.0)
MCV: 128.3 fL — ABNORMAL HIGH (ref 80.0–100.0)
Monocytes Absolute: 0.7 10*3/uL (ref 0.1–1.0)
Monocytes Relative: 9 %
Neutro Abs: 4.2 10*3/uL (ref 1.7–7.7)
Neutrophils Relative %: 55 %
Platelets: 208 10*3/uL (ref 150–400)
RBC: <1 MIL/uL — ABNORMAL LOW (ref 3.87–5.11)
WBC: 7.7 10*3/uL (ref 4.0–10.5)
nRBC: 171.5 % — ABNORMAL HIGH (ref 0.0–0.2)

## 2023-03-15 LAB — BPAM RBC
Blood Product Expiration Date: 202411042359
Blood Product Expiration Date: 202410192359
ISSUE DATE / TIME: 202410042222
ISSUE DATE / TIME: 202410050114
PRODUCT CODE: 202411042359
Unit Type and Rh: 9500
Unit Type and Rh: 7300
Unit Type and Rh: 9500

## 2023-03-15 LAB — TYPE AND SCREEN
ABO/RH(D): B POS
Antibody Screen: POSITIVE
Unit division: 0
Unit division: 0

## 2023-07-25 IMAGING — DX DG CHEST 2V
2 series · 2 of 2 positions shown · non-contrast
Comparison: None.

CLINICAL DATA: Productive cough.

EXAM:
CHEST - 2 VIEW

[chest pa]
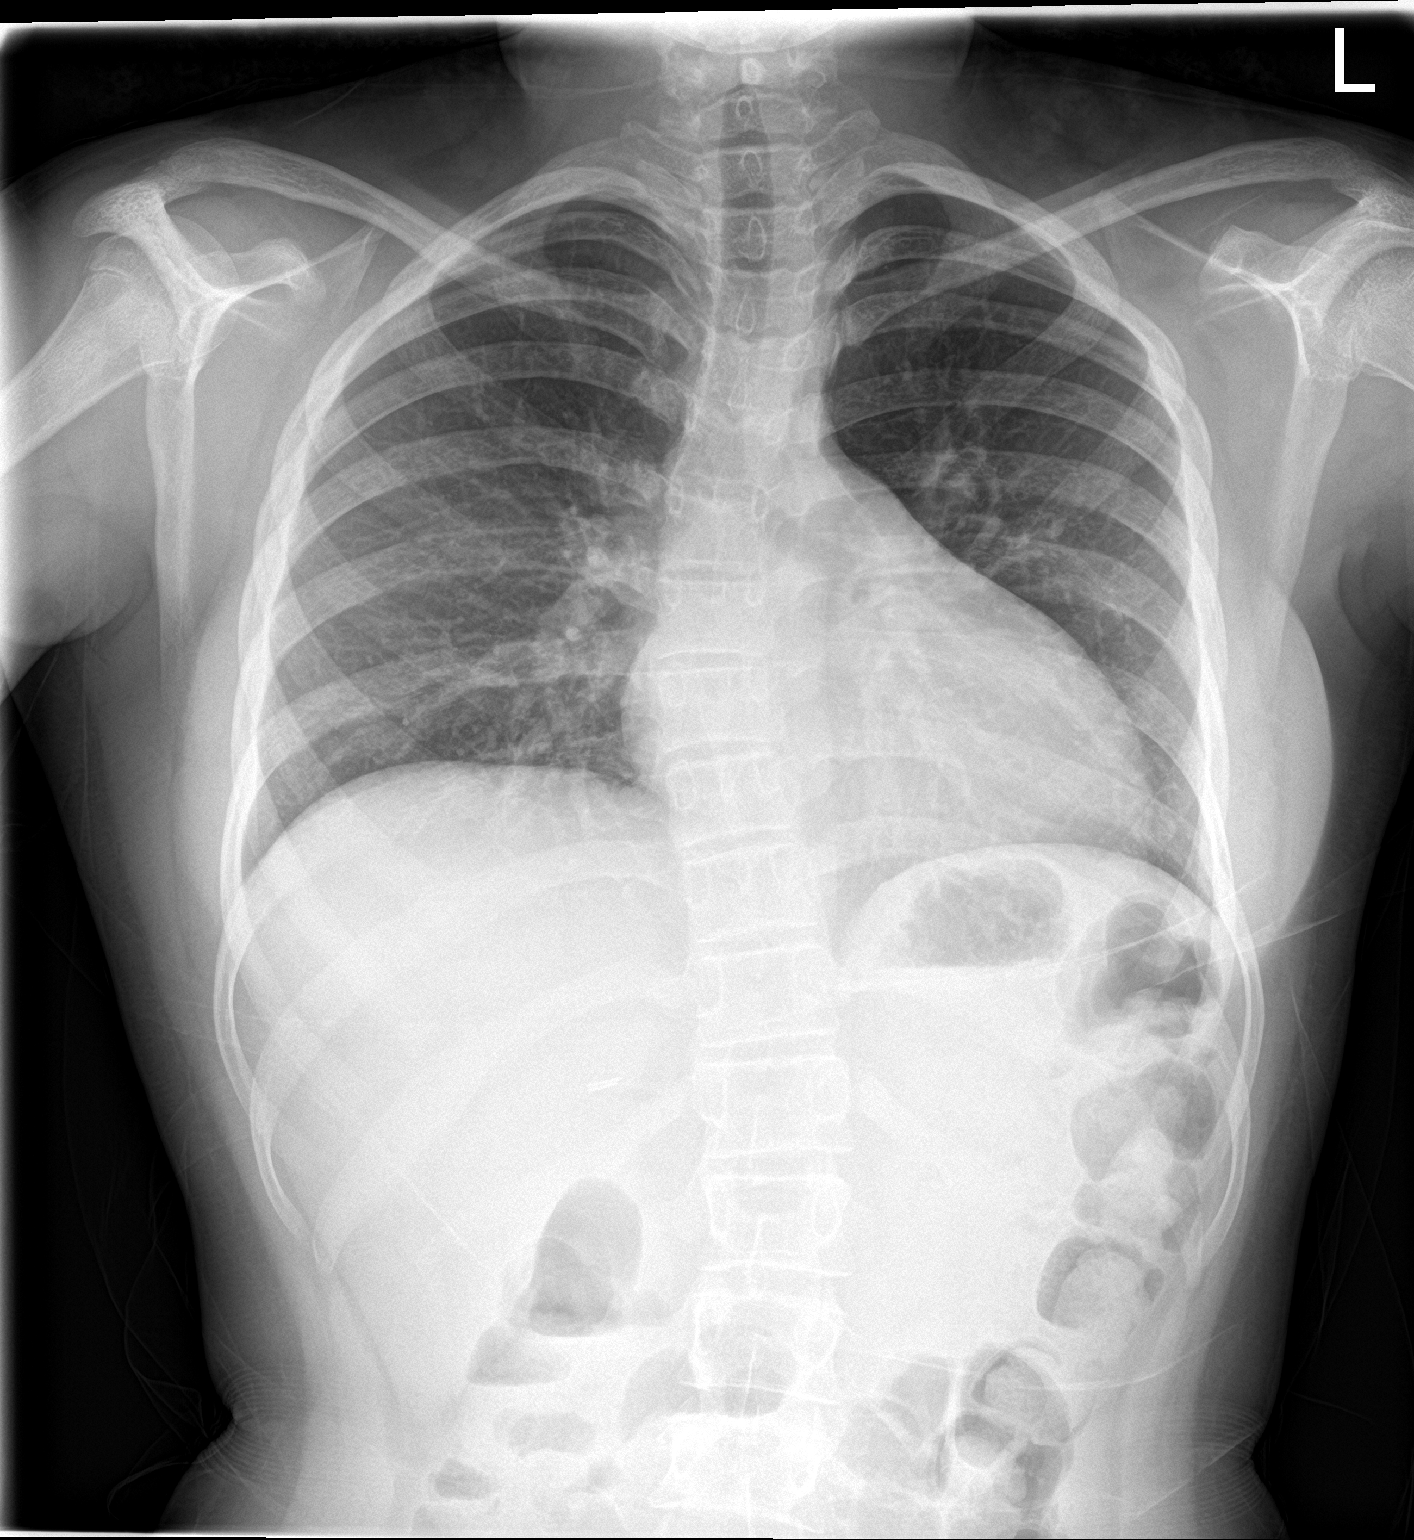

[chest lat]
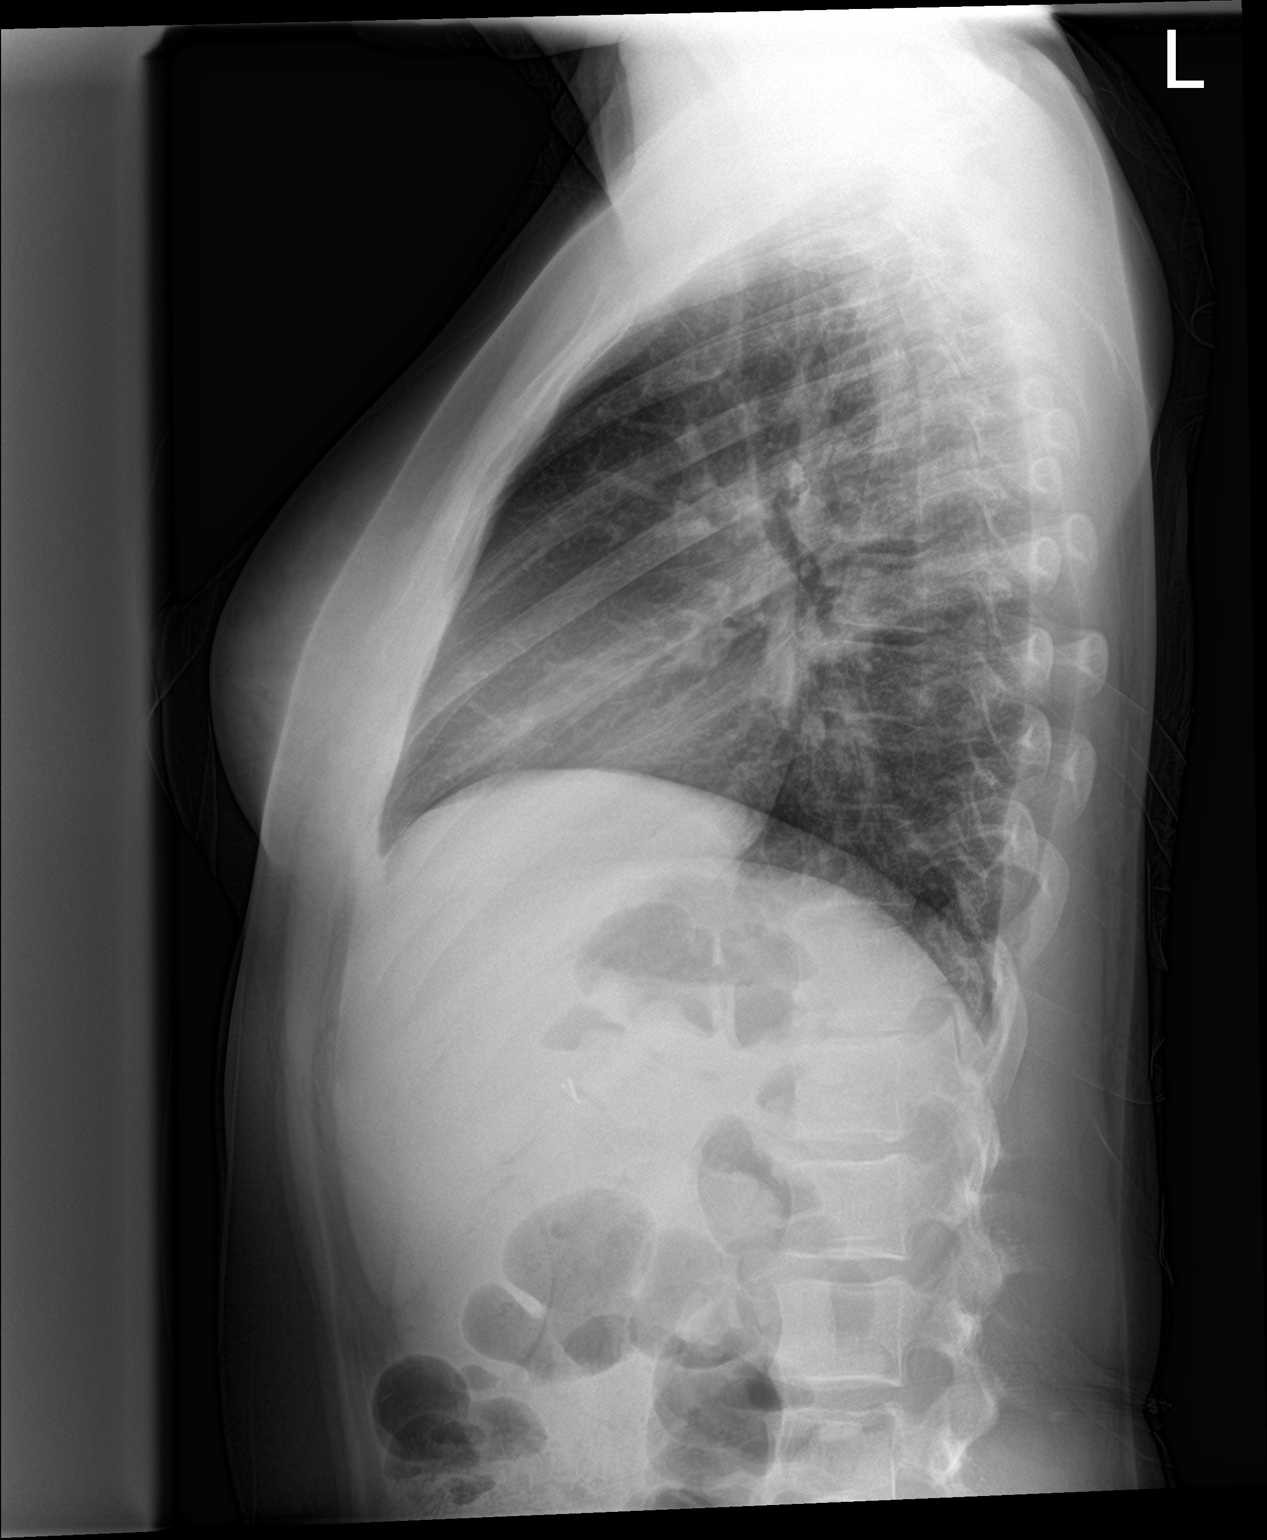

[2 of 2 positions shown; findings below may reference images not displayed]

FINDINGS: The heart size and mediastinal contours are within normal limits.
Both lungs are clear. The visualized skeletal structures are
unremarkable.
IMPRESSION: No active cardiopulmonary disease.
